# Patient Record
Sex: Female | Born: 1975
Health system: Southern US, Community
[De-identification: ages and names within clinical notes are randomized; demographics above are authoritative.]

## PROBLEM LIST (undated history)

## (undated) DIAGNOSIS — L509 Urticaria, unspecified: Secondary | ICD-10-CM

## (undated) DIAGNOSIS — B373 Candidiasis of vulva and vagina: Secondary | ICD-10-CM

## (undated) DIAGNOSIS — F419 Anxiety disorder, unspecified: Secondary | ICD-10-CM

## (undated) DIAGNOSIS — M1990 Unspecified osteoarthritis, unspecified site: Secondary | ICD-10-CM

## (undated) DIAGNOSIS — B019 Varicella without complication: Secondary | ICD-10-CM

## (undated) DIAGNOSIS — N946 Dysmenorrhea, unspecified: Secondary | ICD-10-CM

## (undated) DIAGNOSIS — N939 Abnormal uterine and vaginal bleeding, unspecified: Secondary | ICD-10-CM

## (undated) DIAGNOSIS — J302 Other seasonal allergic rhinitis: Secondary | ICD-10-CM

## (undated) DIAGNOSIS — B3731 Acute candidiasis of vulva and vagina: Secondary | ICD-10-CM

## (undated) DIAGNOSIS — B009 Herpesviral infection, unspecified: Secondary | ICD-10-CM

## (undated) HISTORY — DX: Dysmenorrhea, unspecified: N94.6

## (undated) HISTORY — DX: Unspecified osteoarthritis, unspecified site: M19.90

## (undated) HISTORY — DX: Urticaria, unspecified: L50.9

## (undated) HISTORY — DX: Candidiasis of vulva and vagina: B37.3

## (undated) HISTORY — DX: Varicella without complication: B01.9

## (undated) HISTORY — DX: Abnormal uterine and vaginal bleeding, unspecified: N93.9

## (undated) HISTORY — DX: Anxiety disorder, unspecified: F41.9

## (undated) HISTORY — DX: Other seasonal allergic rhinitis: J30.2

## (undated) HISTORY — PX: WISDOM TOOTH EXTRACTION: SHX21

## (undated) HISTORY — DX: Herpesviral infection, unspecified: B00.9

## (undated) HISTORY — DX: Acute candidiasis of vulva and vagina: B37.31

---

## 2002-07-24 ENCOUNTER — Other Ambulatory Visit: Admission: RE | Admit: 2002-07-24 | Discharge: 2002-07-24 | Payer: Self-pay | Admitting: Obstetrics & Gynecology

## 2003-10-12 ENCOUNTER — Other Ambulatory Visit: Admission: RE | Admit: 2003-10-12 | Discharge: 2003-10-12 | Payer: Self-pay | Admitting: Obstetrics and Gynecology

## 2004-09-13 ENCOUNTER — Other Ambulatory Visit: Admission: RE | Admit: 2004-09-13 | Discharge: 2004-09-13 | Payer: Self-pay | Admitting: Obstetrics and Gynecology

## 2007-11-21 ENCOUNTER — Inpatient Hospital Stay (HOSPITAL_COMMUNITY): Admission: AD | Admit: 2007-11-21 | Discharge: 2007-11-24 | Payer: Self-pay | Admitting: Obstetrics and Gynecology

## 2010-11-03 ENCOUNTER — Inpatient Hospital Stay (HOSPITAL_COMMUNITY)
Admission: AD | Admit: 2010-11-03 | Discharge: 2010-11-05 | DRG: 774 | Disposition: A | Discharge status: Home / Self Care | Payer: Managed Care, Other (non HMO) | Source: Ambulatory Visit | Attending: Obstetrics and Gynecology | Admitting: Obstetrics and Gynecology

## 2010-11-03 DIAGNOSIS — O4290 Premature rupture of membranes, unspecified as to length of time between rupture and onset of labor, unspecified weeks of gestation: Principal | ICD-10-CM | POA: Diagnosis present

## 2010-11-03 LAB — CBC
Hemoglobin: 12.4 g/dL (ref 12.0–15.0)
MCHC: 34 g/dL (ref 30.0–36.0)
MCV: 93.9 fL (ref 78.0–100.0)
Platelets: 287 10*3/uL (ref 150–400)
RBC: 3.62 MIL/uL — ABNORMAL LOW (ref 3.87–5.11)
WBC: 10.1 10*3/uL (ref 4.0–10.5)
WBC: 8.4 10*3/uL (ref 4.0–10.5)

## 2010-11-03 LAB — RPR: RPR Ser Ql: NONREACTIVE

## 2010-11-04 LAB — CBC
Hemoglobin: 11 g/dL — ABNORMAL LOW (ref 12.0–15.0)
MCH: 32.2 pg (ref 26.0–34.0)
RBC: 3.42 MIL/uL — ABNORMAL LOW (ref 3.87–5.11)

## 2010-11-04 MED ORDER — BISACODYL 10 MG RE SUPP: 10.0000 mg | Freq: Every day | RECTAL | Status: DC | PRN

## 2010-11-04 MED ORDER — WITCH HAZEL-GLYCERIN EX PADS
MEDICATED_PAD | CUTANEOUS | Status: DC | PRN
  Filled 2010-11-04: qty 100

## 2010-11-04 MED ORDER — ZOLPIDEM TARTRATE 5 MG PO TABS: 5.0000 mg | ORAL_TABLET | Freq: Every evening | ORAL | Status: DC | PRN

## 2010-11-04 MED ORDER — IBUPROFEN 600 MG PO TABS
600.0000 mg | ORAL_TABLET | Freq: Four times a day (QID) | ORAL | Status: DC
  Administered 2010-11-05 (×2): 600 mg via ORAL
  Filled 2010-11-04: qty 1

## 2010-11-04 MED ORDER — ONDANSETRON HCL 4 MG PO TABS: 4.0000 mg | ORAL_TABLET | ORAL | Status: DC | PRN

## 2010-11-04 MED ORDER — FLEET ENEMA 7-19 GM/118ML RE ENEM
1.0000 | ENEMA | Freq: Every day | RECTAL | Status: DC | PRN
  Filled 2010-11-04: qty 1

## 2010-11-04 MED ORDER — SENNOSIDES-DOCUSATE SODIUM 8.6-50 MG PO TABS: 1.0000 | ORAL_TABLET | Freq: Every day | ORAL | Status: DC

## 2010-11-04 MED ORDER — MAGNESIUM HYDROXIDE 400 MG/5ML PO SUSP: 30.0000 mL | ORAL | Status: DC | PRN

## 2010-11-04 MED ORDER — HYDROCORTISONE ACE-PRAMOXINE 1-1 % RE FOAM: 1.0000 | RECTAL | Status: DC | PRN

## 2010-11-04 MED ORDER — DIBUCAINE 1 % RE OINT: TOPICAL_OINTMENT | RECTAL | Status: DC | PRN

## 2010-11-04 MED ORDER — CALCIUM CARBONATE ANTACID 500 MG PO CHEW: 2.0000 | CHEWABLE_TABLET | Freq: Every day | ORAL | Status: DC

## 2010-11-04 MED ORDER — METHYLERGONOVINE MALEATE 0.2 MG/ML IJ SOLN: 0.2000 mg | Freq: Four times a day (QID) | INTRAMUSCULAR | Status: DC | PRN

## 2010-11-04 MED ORDER — SIMETHICONE 80 MG PO CHEW
80.0000 mg | CHEWABLE_TABLET | ORAL | Status: DC | PRN
  Filled 2010-11-04: qty 1

## 2010-11-04 MED ORDER — DIPHENHYDRAMINE HCL 25 MG PO CAPS: 25.0000 mg | ORAL_CAPSULE | Freq: Four times a day (QID) | ORAL | Status: DC | PRN

## 2010-11-04 MED ORDER — METHYLERGONOVINE MALEATE 0.2 MG PO TABS: 0.2000 mg | ORAL_TABLET | ORAL | Status: DC | PRN

## 2010-11-04 MED ORDER — OXYCODONE-ACETAMINOPHEN 5-325 MG PO TABS: 1.0000 | ORAL_TABLET | ORAL | Status: DC | PRN

## 2010-11-04 MED ORDER — MEASLES, MUMPS & RUBELLA VAC ~~LOC~~ INJ
0.5000 mL | INJECTION | SUBCUTANEOUS | Status: DC | PRN
  Filled 2010-11-04: qty 0.5

## 2010-11-04 MED ORDER — PSEUDOEPHEDRINE HCL 30 MG PO TABS: 60.0000 mg | ORAL_TABLET | ORAL | Status: DC | PRN

## 2010-11-04 MED ORDER — GUAIFENESIN 100 MG/5ML PO SOLN: 15.0000 mL | ORAL | Status: DC | PRN

## 2010-11-04 MED ORDER — PRENATAL PLUS 27-1 MG PO TABS
1.0000 | ORAL_TABLET | Freq: Every day | ORAL | Status: DC
  Administered 2010-11-05: 1 via ORAL

## 2010-11-04 MED ORDER — FERROUS SULFATE 325 (65 FE) MG PO TABS
325.0000 mg | ORAL_TABLET | Freq: Two times a day (BID) | ORAL | Status: DC
  Administered 2010-11-05: 325 mg via ORAL
  Filled 2010-11-04: qty 1

## 2010-11-04 MED ORDER — SODIUM CHLORIDE 0.9 % IJ SOLN
3.0000 mL | Freq: Two times a day (BID) | INTRAMUSCULAR | Status: DC
  Filled 2010-11-04: qty 3

## 2010-11-04 MED ORDER — MENTHOL 3 MG MT LOZG: 1.0000 | LOZENGE | OROMUCOSAL | Status: DC | PRN

## 2010-11-04 MED ORDER — CALCIUM CARBONATE ANTACID 500 MG PO CHEW: 1.0000 | CHEWABLE_TABLET | ORAL | Status: DC | PRN

## 2010-11-04 MED ORDER — OXYTOCIN 10 UNIT/ML IJ SOLN
20.0000 [IU] | INTRAVENOUS | Status: DC | PRN
  Filled 2010-11-04: qty 2

## 2010-11-04 MED ORDER — BENZOCAINE-MENTHOL 20-0.5 % EX AERO: 1.0000 "application " | INHALATION_SPRAY | Freq: Four times a day (QID) | CUTANEOUS | Status: DC | PRN

## 2010-11-05 MED ORDER — HYDROCODONE-ACETAMINOPHEN 5-500 MG PO TABS: 1.0000 | ORAL_TABLET | ORAL | Status: AC | PRN

## 2010-11-05 NOTE — Progress Notes (Signed)
Post Partum Day 2  Subjective: no complaints, up ad lib, voiding, tolerating PO and + flatus  Objective: Blood pressure 103/62, pulse 67, temperature 98.8 F (37.1 C), temperature source Oral, resp. rate 20.  Physical Exam:  General: alert, cooperative and no distress Lochia: appropriate Uterine Fundus: firm Incision: N/A DVT Evaluation: No evidence of DVT seen on physical exam.   Basename 11/04/10 0545 11/03/10 1335  HGB 11.0* 11.6*  HCT 32.1* 34.0*    Assessment/Plan: Discharge home   LOS: 2 days   Emileo Semel H. 11/05/2010, 1:19 PM

## 2010-11-05 NOTE — Discharge Summary (Signed)
Obstetric Discharge Summary Reason for Admission: onset of labor Prenatal Procedures: ultrasound Intrapartum Procedures: spontaneous vaginal delivery Postpartum Procedures: none Complications-Operative and Postpartum: 2 degree perineal laceration  Hemoglobin  Date Value Range Status  11/04/2010 11.0* 12.0-15.0 (g/dL) Final     HCT  Date Value Range Status  11/04/2010 32.1* 36.0-46.0 (%) Final    Discharge Diagnoses: Term Pregnancy-delivered  Discharge Information: Date: 11/05/2010 Activity: pelvic rest Diet: routine Medications: Viocodin Condition: stable Instructions: refer to practice specific booklet Discharge to: home   Newborn Data: Live born  Information for the patient's newborn:  Shantara, Goosby [[id number]]  female ; APGAR , ; weight ;  Home with mother.  Lydia Torres. 11/05/2010, 11:06 AM

## 2011-01-27 LAB — CBC
HCT: 30.4 — ABNORMAL LOW
HCT: 39.6
Hemoglobin: 10.6 — ABNORMAL LOW
MCV: 96.7
Platelets: 327
RBC: 3.17 — ABNORMAL LOW
RBC: 4.1
RDW: 13.3
WBC: 9.9

## 2011-01-27 LAB — RPR: RPR Ser Ql: NONREACTIVE

## 2011-09-30 HISTORY — PX: INTRAUTERINE DEVICE INSERTION: SHX323

## 2012-06-18 ENCOUNTER — Other Ambulatory Visit: Payer: Self-pay | Admitting: Obstetrics and Gynecology

## 2012-06-18 DIAGNOSIS — N63 Unspecified lump in unspecified breast: Secondary | ICD-10-CM

## 2012-06-26 ENCOUNTER — Ambulatory Visit
Admission: RE | Admit: 2012-06-26 | Discharge: 2012-06-26 | Disposition: A | Discharge status: Home / Self Care | Payer: BC Managed Care – PPO | Source: Ambulatory Visit | Attending: Obstetrics and Gynecology | Admitting: Obstetrics and Gynecology

## 2012-06-26 DIAGNOSIS — N63 Unspecified lump in unspecified breast: Secondary | ICD-10-CM

## 2014-11-12 ENCOUNTER — Ambulatory Visit (INDEPENDENT_AMBULATORY_CARE_PROVIDER_SITE_OTHER): Payer: BLUE CROSS/BLUE SHIELD | Admitting: Obstetrics and Gynecology

## 2014-11-12 ENCOUNTER — Encounter: Payer: Self-pay | Admitting: Obstetrics and Gynecology

## 2014-11-12 VITALS — BP 100/60 | HR 60 | Resp 18 | Ht 64.75 in | Wt 149.0 lb

## 2014-11-12 DIAGNOSIS — Z0000 Encounter for general adult medical examination without abnormal findings: Secondary | ICD-10-CM

## 2014-11-12 DIAGNOSIS — Z01419 Encounter for gynecological examination (general) (routine) without abnormal findings: Secondary | ICD-10-CM

## 2014-11-12 LAB — POCT URINALYSIS DIPSTICK
BILIRUBIN UA: NEGATIVE
Blood, UA: NEGATIVE
Glucose, UA: NEGATIVE
Ketones, UA: NEGATIVE
Leukocytes, UA: NEGATIVE
Nitrite, UA: NEGATIVE
PROTEIN UA: NEGATIVE
Urobilinogen, UA: NEGATIVE
pH, UA: 5

## 2014-11-12 MED ORDER — VALACYCLOVIR HCL 1 G PO TABS: ORAL_TABLET | ORAL | Status: DC

## 2014-11-12 NOTE — Patient Instructions (Signed)

## 2014-11-12 NOTE — Progress Notes (Signed)
39 y.o. G39P2002 Married Caucasian female here for annual exam.    Has Mirena place June 2013.  This treats heavy and painful periods. Occasionally has spotting.  Considering vasectomy.   Prior patient of mine, returning to care.  Son is 7 and daughter is 4. Works for [employer].  Menarche age 55.   Hx recurrent yeast infections.  When she has them, they last for months or years.  Prolonged Diflucan seems to be the best  This was very difficult to diagnose. Ultimately, it showed up on her pap smear.  Has fever blisters and uses Valtrex for this . Needs refills.   PCP:   Marton Redwood - labs with PCP.   No LMP recorded. Patient is not currently having periods (Reason: IUD).          Sexually active: Yes.    The current method of family planning is IUD.    Exercising: Yes.    Running Smoker:  no  Health Maintenance: Pap:  07/2013? - Normal. History of abnormal Pap:  no MMG:  06/26/12 Diagnostic Bilateral BIRADS1:neg, [place] Right BIRADS1:neg.  This was done due to breast pain and possible right breast lump.  Evaluation was normal. TDaP:  2015 Screening Labs:  Hb today: PCP, Urine today: Clear   reports that she has never smoked. She has never used smokeless tobacco. She reports that she drinks about 1.8 - 3.0 oz of alcohol per week. She reports that she does not use illicit drugs.  Past Medical History  Diagnosis Date  . Abnormal uterine bleeding   . Dysmenorrhea   . Vaginal yeast infection     Recurrent   . HSV-1 infection     fever blisters    Past Surgical History  Procedure Laterality Date  . Intrauterine device insertion  09/2011    Mirena  . Wisdom tooth extraction      Current Outpatient Prescriptions  Medication Sig Dispense Refill  . levonorgestrel (MIRENA) 20 MCG/24HR IUD 1 each by Intrauterine route once. 09/2011    . valACYclovir (VALTREX) 1000 MG tablet Take 2 tablets (2000 mg) by mouth every 12 hours for 24 hours prn. 30 tablet 1   No current  facility-administered medications for this visit.    Family History  Problem Relation Age of Onset  . Diabetes Mother   . Depression Mother   . Anxiety disorder Mother   . Liver disease Father   . Hypertension Father   . Schizophrenia Brother   . Anxiety disorder Brother   . Depression Brother   . Breast cancer Paternal Aunt     2 paternal aunts with breast CA - BrCA negative    ROS:  Pertinent items are noted in HPI.  Otherwise, a comprehensive ROS was negative.  Exam:   BP 100/60 mmHg  Pulse 60  Resp 18  Ht 5' 4.75" (1.645 m)  Wt 149 lb (67.586 kg)  BMI 24.98 kg/m2    General appearance: alert, cooperative and appears stated age Head: Normocephalic, without obvious abnormality, atraumatic Neck: no adenopathy, supple, symmetrical, trachea midline and thyroid normal to inspection and palpation Lungs: clear to auscultation bilaterally Breasts: normal appearance, no masses or tenderness, Inspection negative, No nipple retraction or dimpling, No nipple discharge or bleeding, No axillary or supraclavicular adenopathy Heart: regular rate and rhythm Abdomen: soft, non-tender; bowel sounds normal; no masses,  no organomegaly Extremities: extremities normal, atraumatic, no cyanosis or edema Skin: Skin color, texture, turgor normal. No rashes or lesions Lymph nodes: Cervical,  supraclavicular, and axillary nodes normal. No abnormal inguinal nodes palpated Neurologic: Grossly normal  Pelvic: External genitalia:  no lesions              Urethra:  normal appearing urethra with no masses, tenderness or lesions              Bartholins and Skenes: normal                 Vagina: normal appearing vagina with normal color and discharge, no lesions              Cervix: no lesions and IUD strings seen.              Pap taken: No. Bimanual Exam:  Uterus:  normal size, contour, position, consistency, mobility, non-tender              Adnexa: normal adnexa and no mass, fullness, tenderness               Rectovaginal: No..  Confirms.              Anus:  normal sphincter tone, no lesions  Chaperone was present for exam.  Assessment:   Well woman visit with normal exam. Hx recurrent yeast infections.  Mirena IUD patient.  Hx of HSV I.  Plan: Yearly mammogram recommended after age 73.  Recommended self breast exam.  Pap and HR HPV as above. Information to patient in written form - Calcium, Vitamin D, regular exercise program including cardiovascular and weight bearing exercise. Labs performed.  No..   See orders. Refills given on medications.   Yes.  Valtrex prn fever blisters.  See orders. Discussed benefits of Mirena IUD.  Discussed recurrent yeast infections.  Discussed importance of coming in for vaginitis evaluation.  Patient also agrees to this.   Follow up annually and prn.    After visit summary provided.

## 2014-11-15 ENCOUNTER — Encounter: Payer: Self-pay | Admitting: Obstetrics and Gynecology

## 2014-12-21 ENCOUNTER — Encounter: Payer: Self-pay | Admitting: Obstetrics and Gynecology

## 2015-04-30 ENCOUNTER — Ambulatory Visit: Payer: BLUE CROSS/BLUE SHIELD | Admitting: Podiatry

## 2015-05-02 HISTORY — PX: BUNIONECTOMY: SHX129

## 2015-05-12 ENCOUNTER — Encounter: Payer: Self-pay | Admitting: Podiatry

## 2015-05-12 ENCOUNTER — Ambulatory Visit (INDEPENDENT_AMBULATORY_CARE_PROVIDER_SITE_OTHER): Payer: BLUE CROSS/BLUE SHIELD | Admitting: Podiatry

## 2015-05-12 ENCOUNTER — Ambulatory Visit (INDEPENDENT_AMBULATORY_CARE_PROVIDER_SITE_OTHER): Payer: BLUE CROSS/BLUE SHIELD

## 2015-05-12 ENCOUNTER — Ambulatory Visit: Payer: BLUE CROSS/BLUE SHIELD

## 2015-05-12 ENCOUNTER — Telehealth: Payer: Self-pay | Admitting: *Deleted

## 2015-05-12 VITALS — BP 103/67 | HR 55 | Resp 16

## 2015-05-12 DIAGNOSIS — M21619 Bunion of unspecified foot: Secondary | ICD-10-CM

## 2015-05-12 NOTE — Telephone Encounter (Signed)
"  I was there today and scheduled surgery for 01/31.  I was told that 01/24 was available.  Is it still available?  I'd like to switch to that date if possible."  That date is available.  I'll make the switch.  Go ahead and register with the surgical center.  Instructions are in your surgical packet.  "I'm scheduled to come in and see him on 05/24/2015.  Do I need to change that to next week?"  No, that date will be fine.

## 2015-05-12 NOTE — Progress Notes (Signed)
   Subjective:    Patient ID: Lydia Torres, female    DOB: 07/01/1975, 40 y.o.   MRN: [id number]  HPI Pt presents with bilateral bunions right over left with h/o infection and blister for which she was prescribed topical abt cream.   Review of Systems  All other systems reviewed and are negative.      Objective:   Physical Exam        Assessment & Plan:

## 2015-05-12 NOTE — Progress Notes (Signed)
Subjective:     Patient ID: Lydia Torres, female   DOB: 1975-11-15, 40 y.o.   MRN: [id number]  HPI patient presents stating that she has a painful bunion on her right foot that had opened up and developed a blister and became infected and has been treated with topical antibiotics. Patient states it's doing better and she's had a history of structural bunions and needs this right one fixed   Review of Systems  All other systems reviewed and are negative.      Objective:   Physical Exam  Constitutional: She is oriented to person, place, and time.  Cardiovascular: Intact distal pulses.   Musculoskeletal: Normal range of motion.  Neurological: She is oriented to person, place, and time.  Skin: Skin is warm.  Nursing note and vitals reviewed.  neurovascular status intact with muscle strength adequate and range of motion within normal limits. Patient's noted to have a large structural deformity right over left with redness around the first metatarsal head right over left and no opening of skin and no proximal edema erythema or drainage noted. 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 Discussed metatarsus adductus deformity and at this time I have recommended due to long-term nature and pain correction of the right foot with aggressive distal osteotomy explaining we will probably not obtain complete correction. Patient wants surgery and I evaluated and explained this to her and she will reappoint for consult and is tentatively scheduled for surgery in the next month

## 2015-05-24 ENCOUNTER — Encounter: Payer: Self-pay | Admitting: Podiatry

## 2015-05-24 ENCOUNTER — Ambulatory Visit (INDEPENDENT_AMBULATORY_CARE_PROVIDER_SITE_OTHER): Payer: BLUE CROSS/BLUE SHIELD | Admitting: Podiatry

## 2015-05-24 VITALS — BP 103/66 | HR 59 | Resp 16

## 2015-05-24 DIAGNOSIS — M21619 Bunion of unspecified foot: Secondary | ICD-10-CM

## 2015-05-24 NOTE — Patient Instructions (Signed)
Pre-Operative Instructions  Congratulations, you have decided to take an important step to improving your quality of life.  You can be assured that the doctors of [facility] will be with you every step of the way.  1. Plan to be at the surgery center/hospital at least 1 (one) hour prior to your scheduled time unless otherwise directed by the surgical center/hospital staff.  You must have a responsible adult accompany you, remain during the surgery and drive you home.  Make sure you have directions to the surgical center/hospital and know how to get there on time. 2. For hospital based surgery you will need to obtain a history and physical form from your family physician within 1 month prior to the date of surgery- we will give you a form for you primary physician.  3. We make every effort to accommodate the date you request for surgery.  There are however, times where surgery dates or times have to be moved.  We will contact you as soon as possible if a change in schedule is required.   4. No Aspirin/Ibuprofen for one week before surgery.  If you are on aspirin, any non-steroidal anti-inflammatory medications (Mobic, Aleve, Ibuprofen) you should stop taking it 7 days prior to your surgery.  You make take Tylenol  For pain prior to surgery.  5. Medications- If you are taking daily heart and blood pressure medications, seizure, reflux, allergy, asthma, anxiety, pain or diabetes medications, make sure the surgery center/hospital is aware before the day of surgery so they may notify you which medications to take or avoid the day of surgery. 6. No food or drink after midnight the night before surgery unless directed otherwise by surgical center/hospital staff. 7. No alcoholic beverages 24 hours prior to surgery.  No smoking 24 hours prior to or 24 hours after surgery. 8. Wear loose pants or shorts- loose enough to fit over bandages, boots, and casts. 9. No slip on shoes, sneakers are best. 10. Bring  your boot with you to the surgery center/hospital.  Also bring crutches or a walker if your physician has prescribed it for you.  If you do not have this equipment, it will be provided for you after surgery. 11. If you have not been contracted by the surgery center/hospital by the day before your surgery, call to confirm the date and time of your surgery. 12. Leave-time from work may vary depending on the type of surgery you have.  Appropriate arrangements should be made prior to surgery with your employer. 13. Prescriptions will be provided immediately following surgery by your doctor.  Have these filled as soon as possible after surgery and take the medication as directed. 14. Remove nail polish on the operative foot. 15. Wash the night before surgery.  The night before surgery wash the foot and leg well with the antibacterial soap provided and water paying special attention to beneath the toenails and in between the toes.  Rinse thoroughly with water and dry well with a towel.  Perform this wash unless told not to do so by your physician.  Enclosed: 1 Ice pack (please put in freezer the night before surgery)   1 Hibiclens skin cleaner   Pre-op Instructions  If you have any questions regarding the instructions, do not hesitate to call our office.  [place]: [address] [place], [place] [address] [phone]  [place]: [address] [place], [place] [address] [phone]  [place]: [address]  [place],  [address] [phone]  Dr. Richard   Tuchman DPM, Dr. Norman Regal DPM Dr. Richard Sikora DPM, Dr. M. Todd Hyatt DPM, Dr. Kathryn Egerton DPM 

## 2015-05-25 ENCOUNTER — Encounter: Payer: Self-pay | Admitting: Podiatry

## 2015-05-25 DIAGNOSIS — M2011 Hallux valgus (acquired), right foot: Secondary | ICD-10-CM | POA: Diagnosis not present

## 2015-05-25 NOTE — Progress Notes (Signed)
Subjective:     Patient ID: Lydia Torres, female   DOB: 1975-12-14, 40 y.o.   MRN: [id number]  HPI patient presents with husband to discuss structural bunion correction right which is been extremely sore and makes shoe gear difficult   Review of Systems     Objective:   Physical Exam Neurovascular status intact muscle strength adequate with hyperostosis and redness first metatarsal right over left with deviation of the big toe against the second toe and multiple signs of metatarsus adductus deformity    Assessment:     Structural HAV deformity right made worse and magnified by metatarsus adductus deformity    Plan:     Reviewed x-rays at great length with patient and husband and explained distal osteotomy and the fact we should be able to put this in a much better functioning position reduce the discomfort she is in but we will not be correcting the metatarsus adductus deformity. Patient understands this and wants this procedure and I allowed her to read consent form going over alternative treatments complications associated with distal osteotomy and explained to her all complications as listed. She wants surgery signed consent form and scheduled for outpatient distal osteotomy surgery right with fixation. At this time I did go ahead and I dispensed air fracture walker for postoperative usage we also reviewed the recovery can take upwards of 6 months

## 2015-05-26 ENCOUNTER — Telehealth: Payer: Self-pay | Admitting: *Deleted

## 2015-05-26 NOTE — Telephone Encounter (Addendum)
Tresa Endo states she needs information concerning this pt's surgery:  DOS, CPT, ICD10, next appt and estimated date for return to work.  Algis Greenhouse to take care of the disability.

## 2015-05-27 ENCOUNTER — Telehealth: Payer: Self-pay | Admitting: Sports Medicine

## 2015-05-27 ENCOUNTER — Telehealth: Payer: Self-pay | Admitting: *Deleted

## 2015-05-27 NOTE — Telephone Encounter (Signed)
Pt states she had urinary retention stopped Percocet and began alternating ibuprofen and tylenol.  Pt complains of severe constipation and nausea, has used OTC stool softners, suppositories and fiber.  Dr. Charlsie Merles state use Dulcolax.  Informed pt of Dr. Beverlee Nims orders.  Pt states she has done that.  I referred pt to her primary doctor is she was severely constipated and uncomfortable.

## 2015-05-27 NOTE — Telephone Encounter (Signed)
Patient called stated that her bladder feels full has unable to successfully urinate up until last night, which it was difficult, and after much straining effort family produce a small amount of urine. Explained to patient that urinary retention is likely from anesthesia that she received from surgery and is less likely related to pain medications. Advised patient that if urinary retention continues to report to the emergency room for bladder scan and possible catheterization. Patient expressed understanding. Informed patient to call back if she has significant issues or problems. -Dr. Marylene Land

## 2015-06-02 ENCOUNTER — Ambulatory Visit: Payer: BLUE CROSS/BLUE SHIELD

## 2015-06-02 ENCOUNTER — Telehealth: Payer: Self-pay | Admitting: *Deleted

## 2015-06-02 ENCOUNTER — Encounter: Payer: Self-pay | Admitting: Podiatry

## 2015-06-02 ENCOUNTER — Ambulatory Visit (INDEPENDENT_AMBULATORY_CARE_PROVIDER_SITE_OTHER): Payer: BLUE CROSS/BLUE SHIELD | Admitting: Podiatry

## 2015-06-02 VITALS — Temp 97.6°F

## 2015-06-02 DIAGNOSIS — M21619 Bunion of unspecified foot: Secondary | ICD-10-CM | POA: Diagnosis not present

## 2015-06-02 DIAGNOSIS — M2011 Hallux valgus (acquired), right foot: Secondary | ICD-10-CM | POA: Diagnosis not present

## 2015-06-02 DIAGNOSIS — Z9889 Other specified postprocedural states: Secondary | ICD-10-CM

## 2015-06-02 NOTE — Telephone Encounter (Signed)
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  Please fax these restrictions to [phone] or call.

## 2015-06-02 NOTE — Progress Notes (Signed)
Subjective:     Patient ID: Lydia Torres, female   DOB: [birth year], 40 y.o.   MRN: [id number]  HPI patient states I'm doing quite a bit better with my right foot with diminished discomfort and I am able to walk without a lot of swelling or pain   Review of Systems     Objective:   Physical Exam  neurovascular status intact negative Homans sign noted with well coapted incision site right first metatarsal with hallux in rectus position and good range of motion dorsi and plantarflexion with no crepitus    Assessment:      doing well post osteotomy first metatarsal right    Plan:      x-rays reviewed with patient and advised this patient on the continuation of physical therapy  Compression and immobilization. Patient will be seen back in 2-3 weeks or earlier if any issues should occur

## 2015-06-03 ENCOUNTER — Other Ambulatory Visit: Payer: BLUE CROSS/BLUE SHIELD

## 2015-06-24 ENCOUNTER — Encounter: Payer: Self-pay | Admitting: Podiatry

## 2015-06-24 ENCOUNTER — Ambulatory Visit: Payer: Self-pay

## 2015-06-24 ENCOUNTER — Ambulatory Visit (INDEPENDENT_AMBULATORY_CARE_PROVIDER_SITE_OTHER): Payer: BLUE CROSS/BLUE SHIELD | Admitting: Podiatry

## 2015-06-24 DIAGNOSIS — M21619 Bunion of unspecified foot: Secondary | ICD-10-CM

## 2015-06-24 DIAGNOSIS — Z9889 Other specified postprocedural states: Secondary | ICD-10-CM

## 2015-06-24 DIAGNOSIS — M2011 Hallux valgus (acquired), right foot: Secondary | ICD-10-CM | POA: Diagnosis not present

## 2015-06-24 NOTE — Progress Notes (Signed)
Subjective:     Patient ID: Lydia Torres, female   DOB: Apr 26, 1976, 40 y.o.   MRN: [id number]  HPI patient states I'm doing real well with my right foot with mild swelling if him on it all day but overall doing well   Review of Systems     Objective:   Physical Exam Neurovascular status intact muscle strength adequate with patient found to have good range of motion but still somewhat restricted first MPJ right with wound edges well coapted and good alignment    Assessment:     Doing well post osteotomy first metatarsal right with good alignment noted and mild restriction range of motion    Plan:     Reviewed x-rays of the foot and allow patient to return to soft shoes and gradual increase in activity and reappoint in about 8 weeks  X-ray report indicates pin is in place with good alignment noted and joint congruence

## 2015-07-20 NOTE — Progress Notes (Signed)
Patient ID: Lydia Torres, female   DOB: [birth year], 40 y.o.   MRN: [id number] Dr. Charlsie Merlesegal performed an [facility] Bunionectomy (cutting and moving bone) w/ pin fixation right foot or screw [facility]2015 [facility]

## 2015-08-04 DIAGNOSIS — Z111 Encounter for screening for respiratory tuberculosis: Secondary | ICD-10-CM | POA: Diagnosis not present

## 2015-08-26 ENCOUNTER — Encounter: Payer: Self-pay | Admitting: Podiatry

## 2015-08-26 ENCOUNTER — Ambulatory Visit (INDEPENDENT_AMBULATORY_CARE_PROVIDER_SITE_OTHER): Payer: BLUE CROSS/BLUE SHIELD | Admitting: Podiatry

## 2015-08-26 ENCOUNTER — Ambulatory Visit (INDEPENDENT_AMBULATORY_CARE_PROVIDER_SITE_OTHER): Payer: BLUE CROSS/BLUE SHIELD

## 2015-08-26 VITALS — BP 99/60 | HR 86 | Resp 12

## 2015-08-26 DIAGNOSIS — M21619 Bunion of unspecified foot: Secondary | ICD-10-CM

## 2015-08-26 DIAGNOSIS — Z9889 Other specified postprocedural states: Secondary | ICD-10-CM

## 2015-08-26 NOTE — Progress Notes (Signed)
Subjective:     Patient ID: Lydia Torres, female   DOB: Jun 19, 1975, 40 y.o.   MRN: [id number]  HPI patient presents stating my right foot feeling good and [facility] [employer] [facility] problem   Review of Systems     Objective:   Physical Exam Neurovascular status intact muscle strength adequate with discomfort in the plantar right first MPJ which is minimal with range of motion which is good with no crepitus of the joint noted    Assessment:     Doing well post osteotomy first metatarsal right with wound edges well coapted    Plan:     Reviewed condition and recommended long-term continued range of motion exercises. Reviewed x-rays with patient discharge at this time and reappoint as needed  X-ray report indicates that the osteotomy is healing well with good alignment noted and good positional component with pins in place

## 2015-11-18 ENCOUNTER — Ambulatory Visit (INDEPENDENT_AMBULATORY_CARE_PROVIDER_SITE_OTHER): Payer: BLUE CROSS/BLUE SHIELD | Admitting: Obstetrics and Gynecology

## 2015-11-18 ENCOUNTER — Encounter: Payer: Self-pay | Admitting: Obstetrics and Gynecology

## 2015-11-18 VITALS — BP 102/64 | HR 70 | Resp 14 | Ht 64.5 in | Wt 153.8 lb

## 2015-11-18 DIAGNOSIS — Z0000 Encounter for general adult medical examination without abnormal findings: Secondary | ICD-10-CM

## 2015-11-18 DIAGNOSIS — Z833 Family history of diabetes mellitus: Secondary | ICD-10-CM

## 2015-11-18 DIAGNOSIS — Z01419 Encounter for gynecological examination (general) (routine) without abnormal findings: Secondary | ICD-10-CM | POA: Diagnosis not present

## 2015-11-18 LAB — CBC
HCT: 39.6 % (ref 35.0–45.0)
HEMOGLOBIN: 13.8 g/dL (ref 11.7–15.5)
MCH: 30.9 pg (ref 27.0–33.0)
MCHC: 34.8 g/dL (ref 32.0–36.0)
MCV: 88.8 fL (ref 80.0–100.0)
MPV: 9.9 fL (ref 7.5–12.5)
Platelets: 345 10*3/uL (ref 140–400)
RBC: 4.46 MIL/uL (ref 3.80–5.10)
RDW: 13.3 % (ref 11.0–15.0)
WBC: 9.1 10*3/uL (ref 3.8–10.8)

## 2015-11-18 LAB — COMPREHENSIVE METABOLIC PANEL
ALBUMIN: 4.1 g/dL (ref 3.6–5.1)
ALT: 17 U/L (ref 6–29)
AST: 21 U/L (ref 10–30)
Alkaline Phosphatase: 39 U/L (ref 33–115)
BILIRUBIN TOTAL: 0.6 mg/dL (ref 0.2–1.2)
BUN: 15 mg/dL (ref 7–25)
CO2: 28 mmol/L (ref 20–31)
CREATININE: 0.75 mg/dL (ref 0.50–1.10)
Calcium: 9.2 mg/dL (ref 8.6–10.2)
Chloride: 101 mmol/L (ref 98–110)
Glucose, Bld: 78 mg/dL (ref 65–99)
Potassium: 3.9 mmol/L (ref 3.5–5.3)
SODIUM: 140 mmol/L (ref 135–146)
TOTAL PROTEIN: 6.4 g/dL (ref 6.1–8.1)

## 2015-11-18 LAB — POCT URINALYSIS DIPSTICK
BILIRUBIN UA: NEGATIVE
Glucose, UA: NEGATIVE
KETONES UA: NEGATIVE
Leukocytes, UA: NEGATIVE
Nitrite, UA: NEGATIVE
PH UA: 5
Protein, UA: NEGATIVE
RBC UA: NEGATIVE
Urobilinogen, UA: NEGATIVE

## 2015-11-18 LAB — LIPID PANEL
Cholesterol: 172 mg/dL (ref 125–200)
HDL: 107 mg/dL (ref >=46)
LDL CALC: 55 mg/dL (ref <130)
Total CHOL/HDL Ratio: 1.6 Ratio (ref <=5.0)
Triglycerides: 51 mg/dL (ref <150)
VLDL: 10 mg/dL (ref <30)

## 2015-11-18 NOTE — Progress Notes (Signed)
Patient ID: Lydia Torres, female   DOB: 1976/02/14, 40 y.o.   MRN: [id number] 40 y.o. G49P2002 Married Caucasian female here for annual exam.    Happy with Mirena IUD.  Occasional spotting.  More cramping.   Feels lumpiness in right breast under the right nipple.  This is consistent for her.  It does come and go.   Wants hemoglobin A1C checked.  Used expired chem strips and had fasting blood glucose about 120.  FH of type II DM.  No hx of gestational DM.  Declines refill of Valtrex.  PCP:  Janalyn Rouse, MD   No LMP recorded. Patient is not currently having periods (Reason: IUD).     Period Cycle (Days):  (no cycles due to Mirena IUD)     Sexually active: Yes.    The current method of family planning is IUD--Mirena inserted 09/2011.    Exercising: Yes.    running and walking. Smoker:  no  Health Maintenance: Pap:  07/2013 normal History of abnormal Pap:  no MMG:  06-26-12 Diag.Bil & Rt.br.US/density cat.3/dermal calcification present/Neg/BiRads1:screening age 89:[facility] Colonoscopy:  n/a BMD:   n/a  Result  n/a TDaP:  2015 Gardasil:   no   Screening Labs:    Urine today: Neg   reports that she has never smoked. She has never used smokeless tobacco. She reports that she drinks about 1.8 - 2.4 oz of alcohol per week. She reports that she does not use illicit drugs.  Past Medical History  Diagnosis Date  . Abnormal uterine bleeding   . Dysmenorrhea   . Vaginal yeast infection     Recurrent   . HSV-1 infection     fever blisters    Past Surgical History  Procedure Laterality Date  . Intrauterine device insertion  09/2011    Mirena  . Wisdom tooth extraction    . Bunionectomy Right 05/2015    Current Outpatient Prescriptions  Medication Sig Dispense Refill  . levonorgestrel (MIRENA) 20 MCG/24HR IUD 1 each by Intrauterine route once. Reported on 05/12/2015    . valACYclovir (VALTREX) 1000 MG tablet Take 2 tablets (2000 mg) by mouth every 12 hours for 24 hours  prn. 30 tablet 1   No current facility-administered medications for this visit.    Family History  Problem Relation Age of Onset  . Diabetes Mother   . Depression Mother   . Anxiety disorder Mother   . Liver disease Father   . Hypertension Father   . Schizophrenia Brother   . Anxiety disorder Brother   . Depression Brother   . Breast cancer Paternal Aunt     2 paternal aunts with breast CA - BrCA negative    ROS:  Pertinent items are noted in HPI.  Otherwise, a comprehensive ROS was negative.  Exam:   BP 102/64 mmHg  Pulse 70  Resp 14  Ht 5' 4.5" (1.638 m)  Wt 153 lb 12.8 oz (69.763 kg)  BMI 26.00 kg/m2    General appearance: alert, cooperative and appears stated age Head: Normocephalic, without obvious abnormality, atraumatic Neck: no adenopathy, supple, symmetrical, trachea midline and thyroid normal to inspection and palpation Lungs: clear to auscultation bilaterally Breasts: normal appearance, no masses or tenderness, Inspection negative, No nipple retraction or dimpling, No nipple discharge or bleeding, No axillary or supraclavicular adenopathy Heart: regular rate and rhythm Abdomen:  soft, non-tender; no masses, no organomegaly Extremities: extremities normal, atraumatic, no cyanosis or edema Skin: Skin color, texture, turgor normal. No rashes or  lesions Lymph nodes: Cervical, supraclavicular, and axillary nodes normal. No abnormal inguinal nodes palpated Neurologic: Grossly normal  Pelvic: External genitalia:  no lesions              Urethra:  normal appearing urethra with no masses, tenderness or lesions              Bartholins and Skenes: normal                 Vagina: normal appearing vagina with normal color and discharge, no lesions              Cervix: no lesions and IUD strings seen.              Pap taken: No. Bimanual Exam:  Uterus:  normal size, contour, position, consistency, mobility, non-tender              Adnexa: normal adnexa and no mass,  fullness, tenderness              Rectal exam: Yes.  .  Confirms.              Anus:  normal sphincter tone, no lesions  Chaperone was present for exam.  Assessment:   Well woman visit with normal exam. Mirena IUD patient.  Hx of HSV I. FH of DM.   Plan: Yearly mammogram recommended after age 58.  Recommended self breast exam.  Pap and HR HPV as above. Discussed Calcium, Vitamin D, regular exercise program including cardiovascular and weight bearing exercise. Labs performed.  Yes.  .   See orders.  Routine labs and HgbA1C. Prescription medication(s) given.  No..   Will contact office if needs more Valtrex. Discussed Mirena IUD exchange next year if desires.  She knows she will need to call the office to proceed with this prior to her annual exam next year.  Follow up annually and prn.       After visit summary provided.

## 2015-11-18 NOTE — Patient Instructions (Signed)
Health Maintenance, Female Adopting a healthy lifestyle and getting preventive care can go a long way to promote health and wellness. Talk with your health care provider about what schedule of regular examinations is right for you. This is a good chance for you to check in with your provider about disease prevention and staying healthy. In between checkups, there are plenty of things you can do on your own. Experts have done a lot of research about which lifestyle changes and preventive measures are most likely to keep you healthy. Ask your health care provider for more information. WEIGHT AND DIET  Eat a healthy diet  Be sure to include plenty of vegetables, fruits, low-fat dairy products, and lean protein.  Do not eat a lot of foods high in solid fats, added sugars, or salt.  Get regular exercise. This is one of the most important things you can do for your health.  Most adults should exercise for at least 150 minutes each week. The exercise should increase your heart rate and make you sweat (moderate-intensity exercise).  Most adults should also do strengthening exercises at least twice a week. This is in addition to the moderate-intensity exercise.  Maintain a healthy weight  Body mass index (BMI) is a measurement that can be used to identify possible weight problems. It estimates body fat based on height and weight. Your health care provider can help determine your BMI and help you achieve or maintain a healthy weight.  For females 20 years of age and older:   A BMI below 18.5 is considered underweight.  A BMI of 18.5 to 24.9 is normal.  A BMI of 25 to 29.9 is considered overweight.  A BMI of 30 and above is considered obese.  Watch levels of cholesterol and blood lipids  You should start having your blood tested for lipids and cholesterol at 39 years of age, then have this test every 5 years.  You may need to have your cholesterol levels checked more often if:  Your lipid  or cholesterol levels are high.  You are older than 39 years of age.  You are at high risk for heart disease.  CANCER SCREENING   Lung Cancer  Lung cancer screening is recommended for adults 55-80 years old who are at high risk for lung cancer because of a history of smoking.  A yearly low-dose CT scan of the lungs is recommended for people who:  Currently smoke.  Have quit within the past 15 years.  Have at least a 30-pack-year history of smoking. A pack year is smoking an average of one pack of cigarettes a day for 1 year.  Yearly screening should continue until it has been 15 years since you quit.  Yearly screening should stop if you develop a health problem that would prevent you from having lung cancer treatment.  Breast Cancer  Practice breast self-awareness. This means understanding how your breasts normally appear and feel.  It also means doing regular breast self-exams. Let your health care provider know about any changes, no matter how small.  If you are in your 20s or 30s, you should have a clinical breast exam (CBE) by a health care provider every 1-3 years as part of a regular health exam.  If you are 40 or older, have a CBE every year. Also consider having a breast X-ray (mammogram) every year.  If you have a family history of breast cancer, talk to your health care provider about genetic screening.  If you   are at high risk for breast cancer, talk to your health care provider about having an MRI and a mammogram every year.  Breast cancer gene (BRCA) assessment is recommended for women who have family members with BRCA-related cancers. BRCA-related cancers include:  Breast.  Ovarian.  Tubal.  Peritoneal cancers.  Results of the assessment will determine the need for genetic counseling and BRCA1 and BRCA2 testing. Cervical Cancer Your health care provider may recommend that you be screened regularly for cancer of the pelvic organs (ovaries, uterus, and  vagina). This screening involves a pelvic examination, including checking for microscopic changes to the surface of your cervix (Pap test). You may be encouraged to have this screening done every 3 years, beginning at age 21.  For women ages 30-65, health care providers may recommend pelvic exams and Pap testing every 3 years, or they may recommend the Pap and pelvic exam, combined with testing for human papilloma virus (HPV), every 5 years. Some types of HPV increase your risk of cervical cancer. Testing for HPV may also be done on women of any age with unclear Pap test results.  Other health care providers may not recommend any screening for nonpregnant women who are considered low risk for pelvic cancer and who do not have symptoms. Ask your health care provider if a screening pelvic exam is right for you.  If you have had past treatment for cervical cancer or a condition that could lead to cancer, you need Pap tests and screening for cancer for at least 20 years after your treatment. If Pap tests have been discontinued, your risk factors (such as having a new sexual partner) need to be reassessed to determine if screening should resume. Some women have medical problems that increase the chance of getting cervical cancer. In these cases, your health care provider may recommend more frequent screening and Pap tests. Colorectal Cancer  This type of cancer can be detected and often prevented.  Routine colorectal cancer screening usually begins at 39 years of age and continues through 39 years of age.  Your health care provider may recommend screening at an earlier age if you have risk factors for colon cancer.  Your health care provider may also recommend using home test kits to check for hidden blood in the stool.  A small camera at the end of a tube can be used to examine your colon directly (sigmoidoscopy or colonoscopy). This is done to check for the earliest forms of colorectal  cancer.  Routine screening usually begins at age 50.  Direct examination of the colon should be repeated every 5-10 years through 39 years of age. However, you may need to be screened more often if early forms of precancerous polyps or small growths are found. Skin Cancer  Check your skin from head to toe regularly.  Tell your health care provider about any new moles or changes in moles, especially if there is a change in a mole's shape or color.  Also tell your health care provider if you have a mole that is larger than the size of a pencil eraser.  Always use sunscreen. Apply sunscreen liberally and repeatedly throughout the day.  Protect yourself by wearing long sleeves, pants, a wide-brimmed hat, and sunglasses whenever you are outside. HEART DISEASE, DIABETES, AND HIGH BLOOD PRESSURE   High blood pressure causes heart disease and increases the risk of stroke. High blood pressure is more likely to develop in:  People who have blood pressure in the high end   of the normal range (130-139/85-89 mm Hg).  People who are overweight or obese.  People who are African American.  If you are 38-23 years of age, have your blood pressure checked every 3-5 years. If you are 61 years of age or older, have your blood pressure checked every year. You should have your blood pressure measured twice--once when you are at a hospital or clinic, and once when you are not at a hospital or clinic. Record the average of the two measurements. To check your blood pressure when you are not at a hospital or clinic, you can use:  An automated blood pressure machine at a pharmacy.  A home blood pressure monitor.  If you are between 45 years and 39 years old, ask your health care provider if you should take aspirin to prevent strokes.  Have regular diabetes screenings. This involves taking a blood sample to check your fasting blood sugar level.  If you are at a normal weight and have a low risk for diabetes,  have this test once every three years after 39 years of age.  If you are overweight and have a high risk for diabetes, consider being tested at a younger age or more often. PREVENTING INFECTION  Hepatitis B  If you have a higher risk for hepatitis B, you should be screened for this virus. You are considered at high risk for hepatitis B if:  You were born in a country where hepatitis B is common. Ask your health care provider which countries are considered high risk.  Your parents were born in a high-risk country, and you have not been immunized against hepatitis B (hepatitis B vaccine).  You have HIV or AIDS.  You use needles to inject street drugs.  You live with someone who has hepatitis B.  You have had sex with someone who has hepatitis B.  You get hemodialysis treatment.  You take certain medicines for conditions, including cancer, organ transplantation, and autoimmune conditions. Hepatitis C  Blood testing is recommended for:  Everyone born from [birth year] through 1964.  Anyone with known risk factors for hepatitis C. Sexually transmitted infections (STIs)  You should be screened for sexually transmitted infections (STIs) including gonorrhea and chlamydia if:  You are sexually active and are younger than 39 years of age.  You are older than 39 years of age and your health care provider tells you that you are at risk for this type of infection.  Your sexual activity has changed since you were last screened and you are at an increased risk for chlamydia or gonorrhea. Ask your health care provider if you are at risk.  If you do not have HIV, but are at risk, it may be recommended that you take a prescription medicine daily to prevent HIV infection. This is called pre-exposure prophylaxis (PrEP). You are considered at risk if:  You are sexually active and do not regularly use condoms or know the HIV status of your partner(s).  You take drugs by injection.  You are sexually  active with a partner who has HIV. Talk with your health care provider about whether you are at high risk of being infected with HIV. If you choose to begin PrEP, you should first be tested for HIV. You should then be tested every 3 months for as long as you are taking PrEP.  PREGNANCY   If you are premenopausal and you may become pregnant, ask your health care provider about preconception counseling.  If you may  become pregnant, take 400 to 800 micrograms (mcg) of folic acid every day.  If you want to prevent pregnancy, talk to your health care provider about birth control (contraception). OSTEOPOROSIS AND MENOPAUSE   Osteoporosis is a disease in which the bones lose minerals and strength with aging. This can result in serious bone fractures. Your risk for osteoporosis can be identified using a bone density scan.  If you are 61 years of age or older, or if you are at risk for osteoporosis and fractures, ask your health care provider if you should be screened.  Ask your health care provider whether you should take a calcium or vitamin D supplement to lower your risk for osteoporosis.  Menopause may have certain physical symptoms and risks.  Hormone replacement therapy may reduce some of these symptoms and risks. Talk to your health care provider about whether hormone replacement therapy is right for you.  HOME CARE INSTRUCTIONS   Schedule regular health, dental, and eye exams.  Stay current with your immunizations.   Do not use any tobacco products including cigarettes, chewing tobacco, or electronic cigarettes.  If you are pregnant, do not drink alcohol.  If you are breastfeeding, limit how much and how often you drink alcohol.  Limit alcohol intake to no more than 1 drink per day for nonpregnant women. One drink equals 12 ounces of beer, 5 ounces of wine, or 1 ounces of hard liquor.  Do not use street drugs.  Do not share needles.  Ask your health care provider for help if  you need support or information about quitting drugs.  Tell your health care provider if you often feel depressed.  Tell your health care provider if you have ever been abused or do not feel safe at home.   This information is not intended to replace advice given to you by your health care provider. Make sure you discuss any questions you have with your health care provider.   Document Released: 10/31/2010 Document Revised: 05/08/2014 Document Reviewed: 03/19/2013 Elsevier Interactive Patient Education [employer].

## 2015-11-19 DIAGNOSIS — J029 Acute pharyngitis, unspecified: Secondary | ICD-10-CM | POA: Diagnosis not present

## 2015-11-19 LAB — HEMOGLOBIN A1C
HEMOGLOBIN A1C: 5 % (ref <5.7)
MEAN PLASMA GLUCOSE: 97 mg/dL

## 2016-02-15 DIAGNOSIS — Z23 Encounter for immunization: Secondary | ICD-10-CM | POA: Diagnosis not present

## 2016-06-05 ENCOUNTER — Telehealth: Payer: Self-pay | Admitting: Obstetrics and Gynecology

## 2016-06-05 DIAGNOSIS — Z30433 Encounter for removal and reinsertion of intrauterine contraceptive device: Secondary | ICD-10-CM

## 2016-06-05 NOTE — Telephone Encounter (Signed)
Spoke with patient. Patient would like to schedule Mirena removal and replaced. Due for replacement by 09/2016. Patient is requesting an early morning appointment due to work schedule. Appointment scheduled for 06/12/2016 at 9 am with Dr.Silva. Patient is agreeable to date and time. Pre procedure instructions given.  Motrin instructions given. Motrin=Advil=Ibuprofen, 800 mg one hour before appointment. Eat a meal and hydrate well before appointment. Orders placed for precert.  Routing to provider for final review. Patient agreeable to disposition. Will close encounter.

## 2016-06-05 NOTE — Telephone Encounter (Signed)
Patient called to speak with the nurse about scheduling an appointment to have her Mirena replaced.

## 2016-06-09 ENCOUNTER — Telehealth: Payer: Self-pay | Admitting: Obstetrics and Gynecology

## 2016-06-09 NOTE — Telephone Encounter (Signed)
Spoke with patient. Patient has a Mirena IUD in place. Is getting this IUD removed and replaced with another IUD on 06/12/2016. States when she had her first Mirena placed she had irregular bleeding for 6 months. This was a new start with Mirena for her. Asking if this will occur again. Advised patient irregular bleeding following insertion of IUD is typically during the first 3-6 months of initial insertion while the body adjusts to the hormones. As she has already had her IUD in placed and her body has regulated should not have long irregular bleeding. Advised may have a couple of days of spotting following removal and reinsertion, but this should subside. Asking when she can have intercourse following insertion. Advised this will be based on her comfort level. Will be covered for contraception as soon as IUD is replaced. Patient verbalizes understanding. Pre procedure instructions given.  Motrin instructions given. Motrin=Advil=Ibuprofen, 800 mg one hour before appointment. Eat a meal and hydrate well before appointment.    Routing to Dr.Silva for review and advise.

## 2016-06-09 NOTE — Telephone Encounter (Signed)
Patient has an appointment on Monday, 06/12/16 for a Mirena.  She is requesting to speak with the nurse to ask questions about what she can expect as far as bleeding or having a cycle when she gets it inserted.

## 2016-06-11 NOTE — Telephone Encounter (Signed)
I agree with your recommendations and advice.  She will be protected against pregnancy immediately with the switch in the IUD as her current IUD is still hormonally active.  You may close the encounter.

## 2016-06-12 ENCOUNTER — Encounter: Payer: Self-pay | Admitting: Obstetrics and Gynecology

## 2016-06-12 ENCOUNTER — Ambulatory Visit (INDEPENDENT_AMBULATORY_CARE_PROVIDER_SITE_OTHER): Payer: BLUE CROSS/BLUE SHIELD | Admitting: Obstetrics and Gynecology

## 2016-06-12 DIAGNOSIS — Z30433 Encounter for removal and reinsertion of intrauterine contraceptive device: Secondary | ICD-10-CM

## 2016-06-12 NOTE — Progress Notes (Signed)
GYNECOLOGY  VISIT   HPI: 41 y.o.   Married  Caucasian  female   G2P2002 with No LMP recorded. Patient is not currently having periods (Reason: IUD).   here for IUD removal and reinsertion . Has Mirena IUD placed 09/2011.  No menses.  This will be her third IUD.  GYNECOLOGIC HISTORY: No LMP recorded. Patient is not currently having periods (Reason: IUD). Contraception:  Mirena  Menopausal hormone therapy:  None Last mammogram:  06/26/12 BIRADS1:neg  Last pap smear:   07/25/13 neg        OB History    Gravida Para Term Preterm AB Living   2 2 2     2    SAB TAB Ectopic Multiple Live Births           2         There are no active problems to display for this patient.   Past Medical History:  Diagnosis Date  . Abnormal uterine bleeding   . Dysmenorrhea   . HSV-1 infection    fever blisters  . Vaginal yeast infection    Recurrent     Past Surgical History:  Procedure Laterality Date  . BUNIONECTOMY Right 05/2015  . INTRAUTERINE DEVICE INSERTION  09/2011   Mirena  . WISDOM TOOTH EXTRACTION      Current Outpatient Prescriptions  Medication Sig Dispense Refill  . levonorgestrel (MIRENA) 20 MCG/24HR IUD 1 each by Intrauterine route once. Reported on 05/12/2015    . valACYclovir (VALTREX) 1000 MG tablet Take 2 tablets (2000 mg) by mouth every 12 hours for 24 hours prn. 30 tablet 1   No current facility-administered medications for this visit.      ALLERGIES: Codeine; Phenergan [promethazine hcl]; and Sulfa antibiotics  Family History  Problem Relation Age of Onset  . Diabetes Mother   . Depression Mother   . Anxiety disorder Mother   . Liver disease Father   . Hypertension Father   . Schizophrenia Brother   . Anxiety disorder Brother   . Depression Brother   . Breast cancer Paternal Aunt     2 paternal aunts with breast CA - BrCA negative    Social History   Social History  . Marital status: Married    Spouse name: N/A  . Number of children: N/A  . Years  of education: N/A   Occupational History  . Not on file.   Social History Main Topics  . Smoking status: Never Smoker  . Smokeless tobacco: Never Used  . Alcohol use 1.8 - 2.4 oz/week    3 - 4 Glasses of wine per week  . Drug use: No  . Sexual activity: Yes    Partners: Male    Birth control/ protection: IUD     Comment: Mirena inserted 09/2011   Other Topics Concern  . Not on file   Social History Narrative  . No narrative on file    ROS:  Pertinent items are noted in HPI.  PHYSICAL EXAMINATION:    BP 104/70 (BP Location: Right Arm, Patient Position: Sitting, Cuff Size: Normal)   Pulse 64   Resp 16   Ht 5' 4.5" (1.638 m)   Wt 158 lb (71.7 kg)   BMI 26.70 kg/m     General appearance: alert, cooperative and appears stated age  Pelvic: External genitalia:  no lesions              Urethra:  normal appearing urethra with no masses, tenderness or lesions  Bartholins and Skenes: normal                 Vagina: normal appearing vagina with normal color and discharge, no lesions              Cervix: no lesions.  IUD strings seen.                Bimanual Exam:  Uterus:  normal size, contour, position, consistency, mobility, non-tender              Adnexa: no mass, fullness, tenderness   Mirena IUD removal and reinsertion of new Mirena IUD.  Lot number [id number], exp 06/20.  Consent for procedure.  Speculum placed.  Hibiclens sterilization.  Paracervical block of 10 cc 1% lidocaine, lot [id number], exp 02/21. Tennaculum to anterior cervical lip. Mirena IUD removed with ring forceps, intact, shown to patient, and discarded. Uterus sounded to 8 cm.  Mirena IUD placed without difficulty.  Strings trimmed.  Bimanual exam repeated and no change noted.  Minimal EBL.  No complications.  Chaperone was present for exam.  ASSESSMENT  Mirena IUD removal and reinsertion of new Mirena IUD.   PLAN  Instructions/precautions given.  Motrin 800 mg po q 8 hrs prn.   Follow up in 4 weeks, sooner as needed.  An After Visit Summary was printed and given to the patient.

## 2016-06-20 DIAGNOSIS — Z111 Encounter for screening for respiratory tuberculosis: Secondary | ICD-10-CM | POA: Diagnosis not present

## 2016-07-17 ENCOUNTER — Ambulatory Visit (INDEPENDENT_AMBULATORY_CARE_PROVIDER_SITE_OTHER): Payer: BLUE CROSS/BLUE SHIELD | Admitting: Obstetrics and Gynecology

## 2016-07-17 ENCOUNTER — Encounter: Payer: Self-pay | Admitting: Obstetrics and Gynecology

## 2016-07-17 VITALS — BP 110/60 | HR 80 | Resp 16 | Wt 156.0 lb

## 2016-07-17 DIAGNOSIS — Z30431 Encounter for routine checking of intrauterine contraceptive device: Secondary | ICD-10-CM

## 2016-07-17 NOTE — Progress Notes (Signed)
GYNECOLOGY  VISIT   HPI: 41 y.o.   Married  Caucasian  female   G2P2002 with No LMP recorded. Patient is not currently having periods (Reason: IUD).   here for   IUD check.  Mirena placed on 06/12/16.  Some cramping for one week post placement.  Spotting for a few days. No bleeding.  Skin feels clearer.  May want strings shortened.  GYNECOLOGIC HISTORY: No LMP recorded. Patient is not currently having periods (Reason: IUD). Contraception:  MIRENA IUD  Menopausal hormone therapy:  n/a Last mammogram:  06/26/12 BIRADS1:neg  Last pap smear:   07/25/13 negative        OB History    Gravida Para Term Preterm AB Living   _0 SAB TAB Ectopic Multiple Live Births           2         There are no active problems to display for this patient.   Past Medical History:  Diagnosis Date  . Abnormal uterine bleeding   . Dysmenorrhea   . HSV-1 infection    fever blisters  . Vaginal yeast infection    Recurrent     Past Surgical History:  Procedure Laterality Date  . BUNIONECTOMY Right 05/2015  . INTRAUTERINE DEVICE INSERTION  09/2011   Mirena  . WISDOM TOOTH EXTRACTION      Current Outpatient Prescriptions  Medication Sig Dispense Refill  . levonorgestrel (MIRENA) 20 MCG/24HR IUD 1 each by Intrauterine route once. Reported on 05/12/2015    . valACYclovir (VALTREX) 1000 MG tablet Take 2 tablets (2000 mg) by mouth every 12 hours for 24 hours prn. 30 tablet 1   No current facility-administered medications for this visit.      ALLERGIES: Codeine; Phenergan [promethazine hcl]; and Sulfa antibiotics  Family History  Problem Relation Age of Onset  . Diabetes Mother   . Depression Mother   . Anxiety disorder Mother   . Liver disease Father   . Hypertension Father   . Schizophrenia Brother   . Anxiety disorder Brother   . Depression Brother   . Breast cancer Paternal Aunt     2 paternal aunts with breast CA - BrCA negative    Social History   Social History  .  Marital status: Married    Spouse name: N/A  . Number of children: N/A  . Years of education: N/A   Occupational History  . Not on file.   Social History Main Topics  . Smoking status: Never Smoker  . Smokeless tobacco: Never Used  . Alcohol use 1.8 - 2.4 oz/week    3 - 4 Glasses of wine per week  . Drug use: No  . Sexual activity: Yes    Partners: Male    Birth control/ protection: IUD     Comment: Mirena inserted 09/2011   Other Topics Concern  . Not on file   Social History Narrative  . No narrative on file    ROS:  Pertinent items are noted in HPI.  PHYSICAL EXAMINATION:    BP 110/60 (BP Location: Right Arm, Patient Position: Sitting, Cuff Size: Normal)   Pulse 80   Resp 16   Wt 156 lb (70.8 kg)   BMI 26.36 kg/m     General appearance: alert, cooperative and appears stated age    Pelvic: External genitalia:  no lesions              Urethra:  normal appearing urethra with no masses, tenderness or lesions              Bartholins and Skenes: normal                 Vagina: normal appearing vagina with normal color and discharge, no lesions              Cervix: no lesions.  IUD strings trimmed to 1.5 cm.                 Bimanual Exam:  Uterus:  normal size, contour, position, consistency, mobility, non-tender              Adnexa: no mass, fullness, tenderness           Chaperone was present for exam.  ASSESSMENT  Mirena IUD patient.  Doing well.   PLAN  Return prn and for annual exam in July 2018.  Strings can be shortened further if desired.   An After Visit Summary was printed and given to the patient.  _15_____ minutes face to face time of which over 50% was spent in counseling.

## 2016-11-23 ENCOUNTER — Ambulatory Visit: Payer: BLUE CROSS/BLUE SHIELD | Admitting: Obstetrics and Gynecology

## 2016-11-27 ENCOUNTER — Ambulatory Visit (INDEPENDENT_AMBULATORY_CARE_PROVIDER_SITE_OTHER): Payer: BLUE CROSS/BLUE SHIELD | Admitting: Obstetrics and Gynecology

## 2016-11-27 ENCOUNTER — Other Ambulatory Visit (HOSPITAL_COMMUNITY)
Admission: RE | Admit: 2016-11-27 | Discharge: 2016-11-27 | Disposition: A | Discharge status: Home / Self Care | Payer: BLUE CROSS/BLUE SHIELD | Source: Ambulatory Visit | Attending: Obstetrics and Gynecology | Admitting: Obstetrics and Gynecology

## 2016-11-27 ENCOUNTER — Encounter: Payer: Self-pay | Admitting: Obstetrics and Gynecology

## 2016-11-27 VITALS — BP 110/70 | HR 72 | Resp 16 | Ht 64.75 in | Wt 157.0 lb

## 2016-11-27 DIAGNOSIS — Z01419 Encounter for gynecological examination (general) (routine) without abnormal findings: Secondary | ICD-10-CM | POA: Diagnosis not present

## 2016-11-27 MED ORDER — VALACYCLOVIR HCL 1 G PO TABS: ORAL_TABLET | ORAL | 1 refills | Status: DC

## 2016-11-27 NOTE — Patient Instructions (Signed)

## 2016-11-27 NOTE — Progress Notes (Signed)
41 y.o. G41P2002 Married Caucasian female here for annual exam.    No menses with Mirena IUD.  No problems with yeast infections.   Occasional night sweats.  Sells diabetes medications.  PCP:   Dr. Brigitte Pulse  No LMP recorded. Patient is not currently having periods (Reason: IUD).           Sexually active: Yes.    The current method of family planning is Mirena IUD inserted 06/12/16.    Exercising: Yes.    high intensity cross training Smoker:  no  Health Maintenance: Pap:  07/2013 normal History of abnormal Pap:  no MMG:  06-26-12 Diag.Bil & Rt.br.US/density cat.3/dermal calcification present/Neg/BiRads1:screening age 27:[facility] Colonoscopy:  n/a BMD:   n/a  Result  n/a TDaP:  2015 HIV: done with pregnancy Hep C: done with pregnancy Screening Labs:  Discuss today, blood drawn and held at lab   reports that she has never smoked. She has never used smokeless tobacco. She reports that she drinks about 1.8 - 2.4 oz of alcohol per week . She reports that she does not use drugs.  Past Medical History:  Diagnosis Date  . Abnormal uterine bleeding   . Dysmenorrhea   . HSV-1 infection    fever blisters  . Vaginal yeast infection    Recurrent     Past Surgical History:  Procedure Laterality Date  . BUNIONECTOMY Right 05/2015  . INTRAUTERINE DEVICE INSERTION  09/2011   Mirena  . WISDOM TOOTH EXTRACTION      Current Outpatient Prescriptions  Medication Sig Dispense Refill  . levonorgestrel (MIRENA) 20 MCG/24HR IUD 1 each by Intrauterine route once. Reported on 05/12/2015    . valACYclovir (VALTREX) 1000 MG tablet Take 2 tablets (2000 mg) by mouth every 12 hours for 24 hours prn. 30 tablet 1   No current facility-administered medications for this visit.     Family History  Problem Relation Age of Onset  . Diabetes Mother   . Depression Mother   . Anxiety disorder Mother   . Liver disease Father   . Hypertension Father   . Cancer Father 8       prostate  .  Schizophrenia Brother   . Anxiety disorder Brother   . Depression Brother   . Breast cancer Paternal Aunt        2 paternal aunts with breast CA - BrCA negative    ROS:  Pertinent items are noted in HPI.  Otherwise, a comprehensive ROS was negative.  Exam:   BP 110/70 (BP Location: Right Arm, Patient Position: Sitting, Cuff Size: Normal)   Pulse 72   Resp 16   Ht 5' 4.75" (1.645 m)   Wt 157 lb (71.2 kg)   BMI 26.33 kg/m     General appearance: alert, cooperative and appears stated age Head: Normocephalic, without obvious abnormality, atraumatic Neck: no adenopathy, supple, symmetrical, trachea midline and thyroid normal to inspection and palpation Lungs: clear to auscultation bilaterally Breasts: normal appearance, no masses or tenderness, No nipple retraction or dimpling, No nipple discharge or bleeding, No axillary or supraclavicular adenopathy Heart: regular rate and rhythm Abdomen: soft, non-tender; no masses, no organomegaly Extremities: extremities normal, atraumatic, no cyanosis or edema Skin: Skin color, texture, turgor normal. No rashes or lesions Lymph nodes: Cervical, supraclavicular, and axillary nodes normal. No abnormal inguinal nodes palpated Neurologic: Grossly normal  Pelvic: External genitalia:  no lesions              Urethra:  normal appearing  urethra with no masses, tenderness or lesions              Bartholins and Skenes: normal                 Vagina: normal appearing vagina with normal color and discharge, no lesions              Cervix: no lesions.  IUD strings seen.              Pap taken: Yes.   Bimanual Exam:  Uterus:  normal size, contour, position, consistency, mobility, non-tender              Adnexa: no mass, fullness, tenderness              Rectal exam: Yes.  .  Confirms.              Anus:  normal sphincter tone, no lesions  Chaperone was present for exam.  Assessment:   Well woman visit with normal exam. Mirena IUD.  HSV I. FH of  breast cancer.  Negative genetic testing.   Plan: Mammogram screening discussed.  [facility] Recommended self breast awareness. Pap and HR HPV as above. Guidelines for Calcium, Vitamin D, regular exercise program including cardiovascular and weight bearing exercise. Cholesterol, CBC, CMP.  Refill of Valtrex. Follow up annually and prn.    After visit summary provided.

## 2016-11-28 ENCOUNTER — Other Ambulatory Visit: Payer: Self-pay | Admitting: Obstetrics and Gynecology

## 2016-11-28 DIAGNOSIS — Z1231 Encounter for screening mammogram for malignant neoplasm of breast: Secondary | ICD-10-CM

## 2016-11-28 LAB — COMPREHENSIVE METABOLIC PANEL
A/G RATIO: 1.8 (ref 1.2–2.2)
ALBUMIN: 4.3 g/dL (ref 3.5–5.5)
ALT: 20 IU/L (ref 0–32)
AST: 23 IU/L (ref 0–40)
Alkaline Phosphatase: 49 IU/L (ref 39–117)
BILIRUBIN TOTAL: 0.4 mg/dL (ref 0.0–1.2)
BUN / CREAT RATIO: 22 (ref 9–23)
BUN: 14 mg/dL (ref 6–24)
CALCIUM: 9.2 mg/dL (ref 8.7–10.2)
CHLORIDE: 101 mmol/L (ref 96–106)
CO2: 23 mmol/L (ref 20–29)
Creatinine, Ser: 0.64 mg/dL (ref 0.57–1.00)
GFR calc Af Amer: 129 mL/min/{1.73_m2} (ref >59)
GFR, EST NON AFRICAN AMERICAN: 112 mL/min/{1.73_m2} (ref >59)
Globulin, Total: 2.4 g/dL (ref 1.5–4.5)
Glucose: 57 mg/dL — ABNORMAL LOW (ref 65–99)
POTASSIUM: 4.5 mmol/L (ref 3.5–5.2)
Sodium: 140 mmol/L (ref 134–144)
Total Protein: 6.7 g/dL (ref 6.0–8.5)

## 2016-11-28 LAB — LIPID PANEL
CHOLESTEROL TOTAL: 179 mg/dL (ref 100–199)
Chol/HDL Ratio: 1.8 ratio (ref 0.0–4.4)
HDL: 97 mg/dL (ref >39)
LDL Calculated: 65 mg/dL (ref 0–99)
Triglycerides: 85 mg/dL (ref 0–149)
VLDL Cholesterol Cal: 17 mg/dL (ref 5–40)

## 2016-11-28 LAB — CBC
HEMOGLOBIN: 13.8 g/dL (ref 11.1–15.9)
Hematocrit: 40.3 % (ref 34.0–46.6)
MCH: 31.2 pg (ref 26.6–33.0)
MCHC: 34.2 g/dL (ref 31.5–35.7)
MCV: 91 fL (ref 79–97)
Platelets: 296 10*3/uL (ref 150–379)
RBC: 4.42 x10E6/uL (ref 3.77–5.28)
RDW: 13.5 % (ref 12.3–15.4)
WBC: 6.8 10*3/uL (ref 3.4–10.8)

## 2016-11-29 LAB — CYTOLOGY - PAP
Diagnosis: NEGATIVE
HPV: NOT DETECTED

## 2016-12-11 ENCOUNTER — Ambulatory Visit
Admission: RE | Admit: 2016-12-11 | Discharge: 2016-12-11 | Disposition: A | Discharge status: Home / Self Care | Payer: BLUE CROSS/BLUE SHIELD | Source: Ambulatory Visit | Attending: Obstetrics and Gynecology | Admitting: Obstetrics and Gynecology

## 2016-12-11 DIAGNOSIS — Z1231 Encounter for screening mammogram for malignant neoplasm of breast: Secondary | ICD-10-CM

## 2017-02-09 DIAGNOSIS — Z23 Encounter for immunization: Secondary | ICD-10-CM | POA: Diagnosis not present

## 2017-03-09 ENCOUNTER — Encounter: Payer: Self-pay | Admitting: Certified Nurse Midwife

## 2017-03-09 ENCOUNTER — Ambulatory Visit (INDEPENDENT_AMBULATORY_CARE_PROVIDER_SITE_OTHER): Payer: BLUE CROSS/BLUE SHIELD | Admitting: Certified Nurse Midwife

## 2017-03-09 VITALS — BP 100/64 | HR 68 | Resp 16 | Ht 64.75 in | Wt 158.0 lb

## 2017-03-09 DIAGNOSIS — R350 Frequency of micturition: Secondary | ICD-10-CM

## 2017-03-09 DIAGNOSIS — B3731 Acute candidiasis of vulva and vagina: Secondary | ICD-10-CM

## 2017-03-09 DIAGNOSIS — T3695XA Adverse effect of unspecified systemic antibiotic, initial encounter: Secondary | ICD-10-CM

## 2017-03-09 DIAGNOSIS — B373 Candidiasis of vulva and vagina: Secondary | ICD-10-CM | POA: Diagnosis not present

## 2017-03-09 DIAGNOSIS — B379 Candidiasis, unspecified: Secondary | ICD-10-CM

## 2017-03-09 LAB — POCT URINALYSIS DIPSTICK
Bilirubin, UA: NEGATIVE
Blood, UA: NEGATIVE
GLUCOSE UA: NEGATIVE
Ketones, UA: NEGATIVE
LEUKOCYTES UA: NEGATIVE
NITRITE UA: NEGATIVE
Protein, UA: NEGATIVE
UROBILINOGEN UA: NEGATIVE U/dL — AB
pH, UA: 5 (ref 5.0–8.0)

## 2017-03-09 MED ORDER — FLUCONAZOLE 150 MG PO TABS: ORAL_TABLET | ORAL | 0 refills | Status: DC

## 2017-03-09 NOTE — Patient Instructions (Signed)

## 2017-03-09 NOTE — Progress Notes (Signed)
41 y.o. Married Caucasian female G2P2002 here with complaint of vaginal symptoms of itching, no burning and but slight increase discharge. Describes discharge as scant with no odor..Onset of symptoms 3  days ago. Denies new personal products or vaginal dryness. no STD concerns. Urinary symptoms none now. She had frequency and pain with urination and treated with 3 days of Cipro she had on hand, and symptoms resolved. Yeast symptoms started after completion.  History of chronic yeast in past, with Diflucan better for her than cream. No other health issues today. Contraception is Mirena IUD.   POCT urine negative  ROS Pertinent to HPI   O:Healthy female WDWN Affect: normal, orientation x 3  Exam:Skin ; warm and dry CVAT bilateral negative Abdomen: non tender, negative suprapubic  inguinal Lymph nodes: no enlargement or tenderness Pelvic exam: External genital: normal female, slight increase pink on vulva area, no scaling or exudate BUS: negative Vagina: scant white, non odorous discharge noted. Some redness at entire introital area, with slight tenderness. Ph: 4.5  ,Wet prep taken, Affirm taken Cervix: normal, non tender, no CMT, IUD string noted in cervix Uterus: normal, non tender Adnexa:normal, non tender, no masses or fullness noted   Wet Prep results: Saline, KOH positive for yeast   A:Normal pelvic exam Contraception Mirena IUD Yeast vaginitis antibiotic induced Chronic history of yeast UTI symptoms resolved with self treatment with Cipro   P:Discussed findings of yeast vagintis and etiology. Discussed Aveeno or baking soda sitz bath for comfort. Avoid moist clothes for extended period of time. If working out in gym clothes for long periods of time change underwear  if possible.  Discussed good diet with limited carbohydrates with yeast history. Increase water intake to prevent further UTI symptoms. Questions addressed. Rx: Diflucan see order with instructions. Lab: Affirm due  to chronic history  Rv prn

## 2017-03-10 LAB — VAGINITIS/VAGINOSIS, DNA PROBE
Candida Species: NEGATIVE
Gardnerella vaginalis: NEGATIVE
Trichomonas vaginosis: NEGATIVE

## 2017-05-15 DIAGNOSIS — Z111 Encounter for screening for respiratory tuberculosis: Secondary | ICD-10-CM | POA: Diagnosis not present

## 2017-07-03 ENCOUNTER — Ambulatory Visit (INDEPENDENT_AMBULATORY_CARE_PROVIDER_SITE_OTHER): Payer: BLUE CROSS/BLUE SHIELD | Admitting: Family Medicine

## 2017-07-03 ENCOUNTER — Encounter: Payer: Self-pay | Admitting: Family Medicine

## 2017-07-03 VITALS — BP 120/60 | HR 50 | Temp 97.5°F | Wt 157.4 lb

## 2017-07-03 DIAGNOSIS — Z8619 Personal history of other infectious and parasitic diseases: Secondary | ICD-10-CM | POA: Insufficient documentation

## 2017-07-03 DIAGNOSIS — Z975 Presence of (intrauterine) contraceptive device: Secondary | ICD-10-CM | POA: Diagnosis not present

## 2017-07-03 DIAGNOSIS — Z111 Encounter for screening for respiratory tuberculosis: Secondary | ICD-10-CM

## 2017-07-03 DIAGNOSIS — H6122 Impacted cerumen, left ear: Secondary | ICD-10-CM | POA: Diagnosis not present

## 2017-07-03 DIAGNOSIS — Z114 Encounter for screening for human immunodeficiency virus [HIV]: Secondary | ICD-10-CM | POA: Diagnosis not present

## 2017-07-03 DIAGNOSIS — Z0000 Encounter for general adult medical examination without abnormal findings: Secondary | ICD-10-CM

## 2017-07-03 DIAGNOSIS — Z0001 Encounter for general adult medical examination with abnormal findings: Secondary | ICD-10-CM | POA: Diagnosis not present

## 2017-07-03 NOTE — Progress Notes (Signed)
Subjective:    Lydia Torres is a 42 y.o. female and is here for a comprehensive physical exam.  Pertinent Gynecological History: No LMP recorded. Patient is not currently having periods (Reason: IUD).  Health Maintenance Due  Topic Date Due  . HIV Screening  11/27/1991  . TETANUS/TDAP  11/27/1995   PMHx, SurgHx, SocialHx, Medications, and Allergies were reviewed in the Visit Navigator and updated as appropriate.   Past Medical History:  Diagnosis Date  . Abnormal uterine bleeding   . Dysmenorrhea   . HSV-1 infection   . Recurrent vaginal yeast infection    Past Surgical History:  Procedure Laterality Date  . BUNIONECTOMY Right 05/2015  . INTRAUTERINE DEVICE INSERTION  09/2011   2013 Mirena removed & re-inserted 06-12-16  . WISDOM TOOTH EXTRACTION     Family History  Problem Relation Age of Onset  . Diabetes Mother   . Depression Mother   . Anxiety disorder Mother   . Liver disease Father   . Hypertension Father   . Prostate cancer Father 63  . Schizophrenia Brother   . Anxiety disorder Brother   . Depression Brother   . Breast cancer Paternal Aunt        2 paternal aunts with breast CA - BrCA negative  . Breast cancer Paternal Aunt    Social History   Tobacco Use  . Smoking status: Never Smoker  . Smokeless tobacco: Never Used  Substance Use Topics  . Alcohol use: Yes    Alcohol/week: 1.8 - 2.4 oz    Types: 3 - 4 Glasses of wine per week  . Drug use: Yes    Types: Anabolic steroids   Review of Systems:   Pertinent items are noted in the HPI. Otherwise, ROS is negative.  Objective:   BP 120/60   Pulse (!) 50   Temp (!) 97.5 F (36.4 C) (Oral)   Wt 157 lb 6.4 oz (71.4 kg)   SpO2 100%   BMI 26.40 kg/m    Wt Readings from Last 3 Encounters:  07/03/17 157 lb 6.4 oz (71.4 kg)  03/09/17 158 lb (71.7 kg)  11/27/16 157 lb (71.2 kg)     Ht Readings from Last 3 Encounters:  03/09/17 5' 4.75" (1.645 m)  11/27/16 5' 4.75" (1.645 m)  06/12/16 5' 4.5"  (1.638 m)   General appearance: alert, cooperative and appears stated age. Head: normocephalic, without obvious abnormality, atraumatic. Neck: no adenopathy, supple, symmetrical, trachea midline; thyroid not enlarged, symmetric, no tenderness/mass/nodules. Lungs: clear to auscultation bilaterally. Heart: regular rate and rhythm Abdomen: soft, non-tender; no masses,  no organomegaly. Extremities: extremities normal, atraumatic, no cyanosis or edema. Skin: skin color, texture, turgor normal, no rashes or lesions. Lymph: cervical, supraclavicular, and axillary nodes normal; no abnormal inguinal nodes palpated. Neurologic: grossly normal.  Assessment/Plan:   Lydia Torres was seen today for establish care.  Diagnoses and all orders for this visit:  Routine physical examination  IUD (intrauterine device) in place, Mirena inserted 06/12/16  Screening for HIV (human immunodeficiency virus) -     HIV antibody; Future  Screening-pulmonary TB -     QuantiFERON-TB Gold Plus; Future  Left ear impacted cerumen Comments:  PROCEDURE: CERUMEN DISIMPACTION    The patient had a large amount of cerumen in the external auditory canal(s): left  Ear wax softener was used prior to the lavage.  Ear lavage was performed on left ear(s) by Lonell Grandchild.  Curettage by provider was not performed in addition.   There were  no complications and following the disimpaction the tympanic membrane was visible.  Charges to be entered in Charge Capture section.  Patient Counseling: [x]    Nutrition: Stressed importance of moderation in sodium/caffeine intake, saturated fat and cholesterol, caloric balance, sufficient intake of fresh fruits, vegetables, fiber, calcium, iron, and 1 mg of folate supplement per day (for females capable of pregnancy).  [x]    Stressed the importance of regular exercise.   [x]    Substance Abuse: Discussed cessation/primary prevention of tobacco, alcohol, or other drug use; driving or  other dangerous activities under the influence; availability of treatment for abuse.   [x]    Injury prevention: Discussed safety belts, safety helmets, smoke detector, smoking near bedding or upholstery.   [x]    Sexuality: Discussed sexually transmitted diseases, partner selection, use of condoms, avoidance of unintended pregnancy  and contraceptive alternatives.  [x]    Dental health: Discussed importance of regular tooth brushing, flossing, and dental visits.  [x]    Health maintenance and immunizations reviewed. Please refer to Health maintenance section.   Briscoe Deutscher, DO [place]

## 2017-07-04 ENCOUNTER — Encounter: Payer: Self-pay | Admitting: Surgical

## 2017-07-06 ENCOUNTER — Other Ambulatory Visit: Payer: Self-pay | Admitting: Family Medicine

## 2017-07-06 DIAGNOSIS — Z111 Encounter for screening for respiratory tuberculosis: Secondary | ICD-10-CM

## 2017-07-06 DIAGNOSIS — Z114 Encounter for screening for human immunodeficiency virus [HIV]: Secondary | ICD-10-CM

## 2017-07-12 ENCOUNTER — Other Ambulatory Visit (INDEPENDENT_AMBULATORY_CARE_PROVIDER_SITE_OTHER): Payer: BLUE CROSS/BLUE SHIELD

## 2017-07-12 DIAGNOSIS — Z114 Encounter for screening for human immunodeficiency virus [HIV]: Secondary | ICD-10-CM

## 2017-07-12 DIAGNOSIS — Z111 Encounter for screening for respiratory tuberculosis: Secondary | ICD-10-CM | POA: Diagnosis not present

## 2017-07-13 LAB — HIV ANTIBODY (ROUTINE TESTING W REFLEX): HIV 1&2 Ab, 4th Generation: NONREACTIVE

## 2017-07-14 LAB — QUANTIFERON-TB GOLD PLUS
Mitogen-NIL: >10 IU/mL
NIL: 0.02 IU/mL
QuantiFERON-TB Gold Plus: NEGATIVE
TB1-NIL: 0.02 IU/mL
TB2-NIL: 0.01 IU/mL

## 2017-07-18 ENCOUNTER — Telehealth: Payer: Self-pay | Admitting: Family Medicine

## 2017-07-18 NOTE — Telephone Encounter (Signed)
Patient would like to come by the office to pick up a copy of her lab results for her QuantiFERON-TB Gold Plus when they are available. She needs the results for her job.   Please contact patient once lab results are ready for pick up at the front office.

## 2017-07-18 NOTE — Telephone Encounter (Signed)
Called pt l/m to let her know at reception for pick up.

## 2017-10-23 ENCOUNTER — Ambulatory Visit: Payer: Self-pay | Admitting: Family Medicine

## 2017-10-23 ENCOUNTER — Encounter: Payer: Self-pay | Admitting: Physician Assistant

## 2017-10-23 ENCOUNTER — Ambulatory Visit (INDEPENDENT_AMBULATORY_CARE_PROVIDER_SITE_OTHER): Payer: BLUE CROSS/BLUE SHIELD | Admitting: Physician Assistant

## 2017-10-23 VITALS — BP 120/84 | HR 53 | Temp 98.8°F | Ht 64.75 in | Wt 156.0 lb

## 2017-10-23 DIAGNOSIS — R42 Dizziness and giddiness: Secondary | ICD-10-CM

## 2017-10-23 LAB — POCT URINE PREGNANCY: Preg Test, Ur: NEGATIVE

## 2017-10-23 NOTE — Progress Notes (Signed)
Lydia Torres is a 42 y.o. female here for a new problem.  I acted as a Education administrator for [employer], PA-C  Anselmo Pickler, LPN  History of Present Illness:   Chief Complaint  Patient presents with  . Dizziness    Dizziness  This is a new problem. Episode onset: Started around 9:30 PM last night. was reading a book in bed to her daughter lying down and when she went to get up had dizziness, sat back down and tried to do exercises that she did for Vertigo 10 years ago and it made her worse and vomited.  Episode frequency: came on all of a sudden last night did have headache dull at base of skull prior to dizziness. Progression since onset: Dizziness is gone today, but does not feel right, feels jittery, off balance, no appetite, feels like she is in a fog. Associated symptoms include headaches (last night for about an hour), nausea (last night) and vomiting (last night x 1). Pertinent negatives include no abdominal pain, change in bowel habit, chills, diaphoresis, fatigue, fever, neck pain or sore throat. The symptoms are aggravated by twisting, bending and standing. She has tried acetaminophen, lying down and rest for the symptoms. The treatment provided significant (Tylenol took headache away) relief.   Feels unsteady, woozy, no appetite but hungry, feels like she is in a fog. No more dizziness.  She has had vertigo in the past and states that her episodes felt just like this. Also, she had a dull HA prior to this, she states that she has a history of migraines, gets them about once a year.  She does feel like the past times when she did have a migraine she had associated nausea with it.   Past Medical History:  Diagnosis Date  . Abnormal uterine bleeding   . Chicken pox   . Dysmenorrhea   . HSV-1 infection   . Recurrent vaginal yeast infection   . Seasonal allergies      Social History   Socioeconomic History  . Marital status: Married    Spouse name: Not on file  . Number of  children: Not on file  . Years of education: Not on file  . Highest education level: Not on file  Occupational History    Employer: [employer]  Social Needs  . Financial resource strain: Not on file  . Food insecurity:    Worry: Not on file    Inability: Not on file  . Transportation needs:    Medical: Not on file    Non-medical: Not on file  Tobacco Use  . Smoking status: Never Smoker  . Smokeless tobacco: Never Used  Substance and Sexual Activity  . Alcohol use: Yes    Alcohol/week: 1.8 - 2.4 oz    Types: 3 - 4 Glasses of wine per week  . Drug use: Yes    Types: Anabolic steroids  . Sexual activity: Yes    Partners: Male    Birth control/protection: IUD  Lifestyle  . Physical activity:    Days per week: Not on file    Minutes per session: Not on file  . Stress: Not on file  Relationships  . Social connections:    Talks on phone: Not on file    Gets together: Not on file    Attends religious service: Not on file    Active member of club or organization: Not on file    Attends meetings of clubs or organizations: Not on file  Relationship status: Not on file  . Intimate partner violence:    Fear of current or ex partner: Not on file    Emotionally abused: Not on file    Physically abused: Not on file    Forced sexual activity: Not on file  Other Topics Concern  . Not on file  Social History Narrative  . Not on file    Past Surgical History:  Procedure Laterality Date  . BUNIONECTOMY Right 05/2015  . INTRAUTERINE DEVICE INSERTION  09/2011   2013 Mirena removed & re-inserted 06-12-16  . WISDOM TOOTH EXTRACTION      Family History  Problem Relation Age of Onset  . Diabetes Mother   . Depression Mother   . Anxiety disorder Mother   . Liver disease Father   . Hypertension Father   . Prostate cancer Father 77  . Schizophrenia Brother   . Anxiety disorder Brother   . Depression Brother   . Breast cancer Paternal Aunt        2 paternal aunts with breast CA -  BrCA negative  . Breast cancer Paternal Aunt     Allergies  Allergen Reactions  . Oxycodone   . Codeine Rash  . Phenergan [Promethazine Hcl] Rash  . Sulfa Antibiotics Rash    Current Medications:   Current Outpatient Medications:  .  levonorgestrel (MIRENA) 20 MCG/24HR IUD, 1 each by Intrauterine route once. Reported on 05/12/2015, Disp: , Rfl:  .  valACYclovir (VALTREX) 1000 MG tablet, Take 2 tablets (2000 mg) by mouth every 12 hours for 24 hours prn., Disp: 30 tablet, Rfl: 1   Review of Systems:   Review of Systems  Constitutional: Negative for chills, diaphoresis, fatigue and fever.  HENT: Negative for sore throat.   Gastrointestinal: Positive for nausea (last night) and vomiting (last night x 1). Negative for abdominal pain and change in bowel habit.  Musculoskeletal: Negative for neck pain.  Neurological: Positive for dizziness and headaches (last night for about an hour).    Vitals:   Vitals:   10/23/17 1455  BP: 120/84  Pulse: (!) 53  Temp: 98.8 F (37.1 C)  TempSrc: Oral  SpO2: 99%  Weight: 156 lb (70.8 kg)  Height: 5' 4.75" (1.645 m)     Body mass index is 26.16 kg/m.  Physical Exam:   Physical Exam  Constitutional: She appears well-developed. She is cooperative.  Non-toxic appearance. She does not have a sickly appearance. She does not appear ill. No distress.  HENT:  Head: Normocephalic and atraumatic.  Right Ear: Tympanic membrane, external ear and ear canal normal. Tympanic membrane is not erythematous, not retracted and not bulging.  Left Ear: Tympanic membrane, external ear and ear canal normal. Tympanic membrane is not erythematous, not retracted and not bulging.  Nose: Nose normal.  Mouth/Throat: Uvula is midline, oropharynx is clear and moist and mucous membranes are normal.  Eyes: Pupils are equal, round, and reactive to light. Conjunctivae, EOM and lids are normal.  Cardiovascular: Normal rate, regular rhythm, S1 normal, S2 normal, normal  heart sounds and normal pulses.  No LE edema  Pulmonary/Chest: Effort normal and breath sounds normal.  Neurological: She is alert. She has normal strength. No cranial nerve deficit or sensory deficit. Coordination and gait normal. GCS eye subscore is 4. GCS verbal subscore is 5. GCS motor subscore is 6.  Skin: Skin is warm, dry and intact.  Psychiatric: She has a normal mood and affect. Her speech is normal and behavior is normal.  Nursing note and vitals reviewed.  Results for orders placed or performed in visit on 10/23/17  POCT urine pregnancy  Result Value Ref Range   Preg Test, Ur Negative Negative   Dix-Hallpike negative bilaterally  Assessment and Plan:    Jerrie was seen today for dizziness.  Diagnoses and all orders for this visit:  Dizziness Symptoms essentially resolved except for residual feelings of fatigue and jitteriness.  She does have a history of low blood sugar, I recommended that she eat small frequent meals with adequate sources of carbohydrate and protein and stay well-hydrated.  Neuro exam was completely benign.  No red flags on her exam today.  Offered antiemetic however patient declined.  Urine pregnancy test is negative.  Will check labs.  If symptoms do not improve, consider imaging or further work-up.  Patient is agreeable to plan.  Briefly discussed with PCP Dr. Briscoe Deutscher. -     CBC -     Comprehensive metabolic panel -     POCT urine pregnancy    . Reviewed expectations re: course of current medical issues. . Discussed self-management of symptoms. . Outlined signs and symptoms indicating need for more acute intervention. . Patient verbalized understanding and all questions were answered. . See orders for this visit as documented in the electronic medical record. . Patient received an After-Visit Summary.  CMA or LPN served as scribe during this visit. History, Physical, and Plan performed by medical provider. Documentation and orders reviewed and  attested to.   Inda Coke, PA-C

## 2017-10-23 NOTE — Telephone Encounter (Signed)
Appointment made for today with Jarold MottoSamantha Worley for OV  ok per Rolly SalterHaley at the practice . Instructions and advice given.  Reason for Disposition . [1] MODERATE dizziness (e.g., interferes with normal activities) AND [2] has been evaluated by physician for this  Answer Assessment - Initial Assessment Questions 1. DESCRIPTION: "Describe your dizziness."       Last night - got out of bed  Got dizzy and  Vomited x 3 or 4 times   2. LIGHTHEADED: "Do you feel lightheaded?" (e.g., somewhat faint, woozy, weak upon standing)          Not lightheaded at this time    3. VERTIGO: "Do you feel like either you or the room is spinning or tilting?" (i.e. vertigo)      Room spinning last night  - tilting   4. SEVERITY: "How bad is it?"  "Do you feel like you are going to faint?" "Can you stand and walk?"   - MILD - walking normally   - MODERATE - interferes with normal activities (e.g., work, school)    - SEVERE - unable to stand, requires support to walk, feels like passing out now.       Feels  Fine now  Just does not fell right  Stomach feels queezie 5. ONSET:  "When did the dizziness begin?"      Last evening  6. AGGRAVATING FACTORS: "Does anything make it worse?" (e.g., standing, change in head position)       Head position aggravated symptoms   7. HEART RATE: "Can you tell me your heart rate?" "How many beats in 15 seconds?"  (Note: not all patients can do this)           Unable at this time   8. CAUSE: "What do you think is causing the dizziness?"           Assumes it is vertigo  That she has had an   9. RECURRENT SYMPTOM: "Have you had dizziness before?" If so, ask: "When was the last time?" "What happened that time?"     10 years  10. OTHER SYMPTOMS: "Do you have any other symptoms?" (e.g., fever, chest pain, vomiting, diarrhea, bleeding)         Vomiting  Had headache yesterday  relieved by tylenol   11. PREGNANCY: "Is there any chance you are pregnant?" "When was your last menstrual period?"    IUD  Protocols used: DIZZINESS [facility]- LIGHTHEADEDNESS-A-AH

## 2017-10-23 NOTE — Patient Instructions (Signed)
It was great to see you!  If your symptoms persist, please let us know. If any stroke like symptoms (vision changes, weakness on one side of the body, severe sudden headache, slurred speech --> go to the ER.)  Keep hydrated and eat small/frequent meals with adequate carbohydrate and protein to maintain normal blood sugars.  We will contact you with your lab results.

## 2017-10-24 LAB — COMPREHENSIVE METABOLIC PANEL
ALBUMIN: 4.4 g/dL (ref 3.5–5.2)
ALT: 23 U/L (ref 0–35)
AST: 23 U/L (ref 0–37)
Alkaline Phosphatase: 43 U/L (ref 39–117)
BUN: 16 mg/dL (ref 6–23)
CHLORIDE: 102 meq/L (ref 96–112)
CO2: 29 meq/L (ref 19–32)
Calcium: 9.7 mg/dL (ref 8.4–10.5)
Creatinine, Ser: 0.83 mg/dL (ref 0.40–1.20)
GFR: 80.56 mL/min (ref >60.00)
Glucose, Bld: 80 mg/dL (ref 70–99)
POTASSIUM: 4.2 meq/L (ref 3.5–5.1)
Sodium: 140 mEq/L (ref 135–145)
Total Bilirubin: 0.5 mg/dL (ref 0.2–1.2)
Total Protein: 7.1 g/dL (ref 6.0–8.3)

## 2017-10-24 LAB — CBC
HEMATOCRIT: 41.8 % (ref 36.0–46.0)
HEMOGLOBIN: 14.4 g/dL (ref 12.0–15.0)
MCHC: 34.3 g/dL (ref 30.0–36.0)
MCV: 90.8 fl (ref 78.0–100.0)
Platelets: 350 10*3/uL (ref 150.0–400.0)
RBC: 4.6 Mil/uL (ref 3.87–5.11)
RDW: 13 % (ref 11.5–15.5)
WBC: 6.7 10*3/uL (ref 4.0–10.5)

## 2017-11-14 ENCOUNTER — Other Ambulatory Visit: Payer: Self-pay | Admitting: Obstetrics and Gynecology

## 2017-11-14 DIAGNOSIS — Z1231 Encounter for screening mammogram for malignant neoplasm of breast: Secondary | ICD-10-CM

## 2017-11-30 ENCOUNTER — Ambulatory Visit: Payer: BLUE CROSS/BLUE SHIELD | Admitting: Obstetrics and Gynecology

## 2017-12-05 NOTE — Progress Notes (Signed)
42 y.o. G37P2002 Married Caucasian female here for annual exam.    Pleased with her IUD.  Since her IUD was changed, she has no periods, PMS, or skin acne.  Tenderness in her right breast for 7 years.   Labs done with PCP in June due to dizziness and HA.  Resolved after 24 hours.   PCP:   Briscoe Deutscher, DO  No LMP recorded. (Menstrual status: IUD).           Sexually active: Yes.    The current method of family planning is IUD.   Mirena placed on 06/12/16.  Exercising: Yes.    running and boot camp Smoker:  no  Health Maintenance: Pap:  11/27/2016 negative, neg HR HPV. History of abnormal Pap:  No. MMG:  12/11/2016 birads 1;negative schedule 12/12/2017 for follow up Colonoscopy:  n/a BMD:   n/a  Result  n/a TDaP:  2015 Gardasil:   No. HIV: done in pregnancy Hep C: done in pregnancy Screening Labs: PCP does  Hb today: n/a, Urine today: n/a   reports that she has never smoked. She has never used smokeless tobacco. She reports that she drinks about 3.0 - 4.0 standard drinks of alcohol per week. She reports that she has current or past drug history. Drug: Anabolic steroids.  Past Medical History:  Diagnosis Date  . Abnormal uterine bleeding   . Chicken pox   . Dysmenorrhea   . HSV-1 infection   . Recurrent vaginal yeast infection   . Seasonal allergies     Past Surgical History:  Procedure Laterality Date  . BUNIONECTOMY Right 05/2015  . INTRAUTERINE DEVICE INSERTION  09/2011   2013 Mirena removed & re-inserted 06-12-16  . WISDOM TOOTH EXTRACTION      Current Outpatient Medications  Medication Sig Dispense Refill  . levonorgestrel (MIRENA) 20 MCG/24HR IUD 1 each by Intrauterine route once. Reported on 05/12/2015    . valACYclovir (VALTREX) 1000 MG tablet Take 2 tablets (2000 mg) by mouth every 12 hours for 24 hours prn. 30 tablet 1   No current facility-administered medications for this visit.     Family History  Problem Relation Age of Onset  . Diabetes Mother    . Depression Mother   . Anxiety disorder Mother   . Liver disease Father   . Hypertension Father   . Prostate cancer Father 36  . Schizophrenia Brother   . Anxiety disorder Brother   . Depression Brother   . Breast cancer Paternal Aunt        2 paternal aunts with breast CA - BrCA negative  . Breast cancer Paternal Aunt     Review of Systems  Constitutional: Negative.   HENT: Negative.   Eyes: Negative.   Respiratory: Negative.   Cardiovascular: Negative.   Gastrointestinal: Negative.   Endocrine: Negative.   Genitourinary: Negative.        Loss of urination when sneezing or cough  Musculoskeletal: Negative.   Skin: Negative.   Allergic/Immunologic: Negative.   Neurological: Negative.   Hematological: Negative.   Psychiatric/Behavioral: Negative.   All other systems reviewed and are negative.   Exam:   BP 116/68 (BP Location: Left Arm, Patient Position: Sitting)   Pulse 68   Resp 16   Ht 5' 4.75" (1.645 m)   Wt 154 lb (69.9 kg)   BMI 25.83 kg/m     General appearance: alert, cooperative and appears stated age Head: Normocephalic, without obvious abnormality, atraumatic Neck: no adenopathy, supple, symmetrical, trachea midline  and thyroid normal to inspection and palpation Lungs: clear to auscultation bilaterally Breasts: normal appearance, no masses or tenderness, No nipple retraction or dimpling, No nipple discharge or bleeding, No axillary or supraclavicular adenopathy Heart: regular rate and rhythm Abdomen: soft, non-tender; no masses, no organomegaly Extremities: extremities normal, atraumatic, no cyanosis or edema Skin: Skin color, texture, turgor normal. No rashes or lesions Lymph nodes: Cervical, supraclavicular, and axillary nodes normal. No abnormal inguinal nodes palpated Neurologic: Grossly normal  Pelvic: External genitalia:  no lesions              Urethra:  normal appearing urethra with no masses, tenderness or lesions              Bartholins and  Skenes: normal                 Vagina: normal appearing vagina with normal color and discharge, no lesions              Cervix: no lesions. IUD strings noted.               Pap taken: No. Bimanual Exam:  Uterus:  normal size, contour, position, consistency, mobility, non-tender              Adnexa: no mass, fullness, tenderness              Rectal exam: Yes.  .  Confirms.              Anus:  normal sphincter tone, no lesions  Chaperone was present for exam.  Assessment:   Well woman visit with normal exam. Mirena IUD.  HSV I. FH of breast cancer.  Negative genetic testing.  Right breast pain.   Plan: Mammogram dx bilateral and right breast US.  Recommended self breast awareness. Pap and HR HPV as above. Guidelines for Calcium, Vitamin D, regular exercise program including cardiovascular and weight bearing exercise. Discussed Gardasil.   Rx for Valtrex.  Follow up annually and prn.   After visit summary provided.

## 2017-12-06 ENCOUNTER — Ambulatory Visit (INDEPENDENT_AMBULATORY_CARE_PROVIDER_SITE_OTHER): Payer: BLUE CROSS/BLUE SHIELD | Admitting: Obstetrics and Gynecology

## 2017-12-06 ENCOUNTER — Other Ambulatory Visit: Payer: Self-pay

## 2017-12-06 ENCOUNTER — Encounter: Payer: Self-pay | Admitting: Obstetrics and Gynecology

## 2017-12-06 VITALS — BP 116/68 | HR 68 | Resp 16 | Ht 64.75 in | Wt 154.0 lb

## 2017-12-06 DIAGNOSIS — Z01419 Encounter for gynecological examination (general) (routine) without abnormal findings: Secondary | ICD-10-CM

## 2017-12-06 DIAGNOSIS — N644 Mastodynia: Secondary | ICD-10-CM

## 2017-12-06 MED ORDER — VALACYCLOVIR HCL 1 G PO TABS: ORAL_TABLET | ORAL | 1 refills | Status: DC

## 2017-12-06 NOTE — Patient Instructions (Signed)
EXERCISE AND DIET:  We recommended that you start or continue a regular exercise program for good health. Regular exercise means any activity that makes your heart beat faster and makes you sweat.  We recommend exercising at least 30 minutes per day at least 3 days a week, preferably 4 or 5.  We also recommend a diet low in fat and sugar.  Inactivity, poor dietary choices and obesity can cause diabetes, heart attack, stroke, and kidney damage, among others.    ALCOHOL AND SMOKING:  Women should limit their alcohol intake to no more than 7 drinks/beers/glasses of wine (combined, not each!) per week. Moderation of alcohol intake to this level decreases your risk of breast cancer and liver damage. And of course, no recreational drugs are part of a healthy lifestyle.  And absolutely no smoking or even second hand smoke. Most people know smoking can cause heart and lung diseases, but did you know it also contributes to weakening of your bones? Aging of your skin?  Yellowing of your teeth and nails?  CALCIUM AND VITAMIN D:  Adequate intake of calcium and Vitamin D are recommended.  The recommendations for exact amounts of these supplements seem to change often, but generally speaking 600 mg of calcium (either carbonate or citrate) and 800 units of Vitamin D per day seems prudent. Certain women may benefit from higher intake of Vitamin D.  If you are among these women, your doctor will have told you during your visit.    PAP SMEARS:  Pap smears, to check for cervical cancer or precancers,  have traditionally been done yearly, although recent scientific advances have shown that most women can have pap smears less often.  However, every woman still should have a physical exam from her gynecologist every year. It will include a breast check, inspection of the vulva and vagina to check for abnormal growths or skin changes, a visual exam of the cervix, and then an exam to evaluate the size and shape of the uterus and  ovaries.  And after 40 years of age, a rectal exam is indicated to check for rectal cancers. We will also provide age appropriate advice regarding health maintenance, like when you should have certain vaccines, screening for sexually transmitted diseases, bone density testing, colonoscopy, mammograms, etc.   MAMMOGRAMS:  All women over 40 years old should have a yearly mammogram. Many facilities now offer a "3D" mammogram, which may cost around $50 extra out of pocket. If possible,  we recommend you accept the option to have the 3D mammogram performed.  It both reduces the number of women who will be called back for extra views which then turn out to be normal, and it is better than the routine mammogram at detecting truly abnormal areas.    COLONOSCOPY:  Colonoscopy to screen for colon cancer is recommended for all women at age 50.  We know, you hate the idea of the prep.  We agree, BUT, having colon cancer and not knowing it is worse!!  Colon cancer so often starts as a polyp that can be seen and removed at colonscopy, which can quite literally save your life!  And if your first colonoscopy is normal and you have no family history of colon cancer, most women don't have to have it again for 10 years.  Once every ten years, you can do something that may end up saving your life, right?  We will be happy to help you get it scheduled when you are ready.    Be sure to check your insurance coverage so you understand how much it will cost.  It may be covered as a preventative service at no cost, but you should check your particular policy.     HPV (Human Papillomavirus) Vaccine: What You Need to Know 1. Why get vaccinated? HPV vaccine prevents infection with human papillomavirus (HPV) types that are associated with many cancers, including:  cervical cancer in females,  vaginal and vulvar cancers in females,  anal cancer in females and males,  throat cancer in females and males, and  penile cancer in  males.  In addition, HPV vaccine prevents infection with HPV types that cause genital warts in both females and males. In the [place] about 12,000 women get cervical cancer every year, and about 4,000 women die from it. HPV vaccine can prevent most of these cases of cervical cancer. Vaccination is not a substitute for cervical cancer screening. This vaccine does not protect against all HPV types that can cause cervical cancer. Women should still get regular Pap tests. HPV infection usually comes from sexual contact, and most people will become infected at some point in their life. About 14 million Americans, including teens, get infected every year. Most infections will go away on their own and not cause serious problems. But thousands of women and men get cancer and other diseases from HPV. 2. HPV vaccine HPV vaccine is approved by FDA and is recommended by CDC for both males and females. It is routinely given at 11 or 41 years of age, but it may be given beginning at age 9 years through age 26 years. Most adolescents 9 through 41 years of age should get HPV vaccine as a two-dose series with the doses separated by 6-12 months. People who start HPV vaccination at 15 years of age and older should get the vaccine as a three-dose series with the second dose given 1-2 months after the first dose and the third dose given 6 months after the first dose. There are several exceptions to these age recommendations. Your health care provider can give you more information. 3. Some people should not get this vaccine  Anyone who has had a severe (life-threatening) allergic reaction to a dose of HPV vaccine should not get another dose.  Anyone who has a severe (life threatening) allergy to any component of HPV vaccine should not get the vaccine.  Tell your doctor if you have any severe allergies that you know of, including a severe allergy to yeast.  HPV vaccine is not recommended for pregnant women. If you learn  that you were pregnant when you were vaccinated, there is no reason to expect any problems for you or your baby. Any woman who learns she was pregnant when she got HPV vaccine is encouraged to contact the manufacturer's registry for HPV vaccination during pregnancy at 1-800-986-8999. Women who are breastfeeding may be vaccinated.  If you have a mild illness, such as a cold, you can probably get the vaccine today. If you are moderately or severely ill, you should probably wait until you recover. Your doctor can advise you. 4. Risks of a vaccine reaction With any medicine, including vaccines, there is a chance of side effects. These are usually mild and go away on their own, but serious reactions are also possible. Most people who get HPV vaccine do not have any serious problems with it. Mild or moderate problems following HPV vaccine:  Reactions in the arm where the shot was given: ? Soreness (about   9 people in 10) ? Redness or swelling (about 1 person in 3)  Fever: ? Mild (100F) (about 1 person in 10) ? Moderate (102F) (about 1 person in 65)  Other problems: ? Headache (about 1 person in 3) Problems that could happen after any injected vaccine:  People sometimes faint after a medical procedure, including vaccination. Sitting or lying down for about 15 minutes can help prevent fainting, and injuries caused by a fall. Tell your doctor if you feel dizzy, or have vision changes or ringing in the ears.  Some people get severe pain in the shoulder and have difficulty moving the arm where a shot was given. This happens very rarely.  Any medication can cause a severe allergic reaction. Such reactions from a vaccine are very rare, estimated at about 1 in a million doses, and would happen within a few minutes to a few hours after the vaccination. As with any medicine, there is a very remote chance of a vaccine causing a serious injury or death. The safety of vaccines is always being monitored. For  more information, visit: www.cdc.gov/vaccinesafety/. 5. What if there is a serious reaction? What should I look for? Look for anything that concerns you, such as signs of a severe allergic reaction, very high fever, or unusual behavior. Signs of a severe allergic reaction can include hives, swelling of the face and throat, difficulty breathing, a fast heartbeat, dizziness, and weakness. These would usually start a few minutes to a few hours after the vaccination. What should I do? If you think it is a severe allergic reaction or other emergency that can't wait, call 9-1-1 or get to the nearest hospital. Otherwise, call your doctor. Afterward, the reaction should be reported to the Vaccine Adverse Event Reporting System (VAERS). Your doctor should file this report, or you can do it yourself through the VAERS web site at www.vaers.hhs.gov, or by calling 1-800-822-7967. VAERS does not give medical advice. 6. The National Vaccine Injury Compensation Program The National Vaccine Injury Compensation Program (VICP) is a federal program that was created to compensate people who may have been injured by certain vaccines. Persons who believe they may have been injured by a vaccine can learn about the program and about filing a claim by calling 1-800-338-2382 or visiting the VICP website at www.hrsa.gov/vaccinecompensation. There is a time limit to file a claim for compensation. 7. How can I learn more?  Ask your health care provider. He or she can give you the vaccine package insert or suggest other sources of information.  Call your local or state health department.  Contact the Centers for Disease Control and Prevention (CDC): ? Call 1-800-232-4636 (1-800-CDC-INFO) or ? Visit CDC's website at www.cdc.gov/hpv Vaccine Information Statement, HPV Vaccine (04/02/2015) This information is not intended to replace advice given to you by your health care provider. Make sure you discuss any questions you have  with your health care provider. Document Released: 11/12/2013 Document Revised: 01/06/2016 Document Reviewed: 01/06/2016 Elsevier Interactive Patient Education  2017 Elsevier Inc.  

## 2017-12-06 NOTE — Progress Notes (Signed)
Patient scheduled while in office for bilateral Dx MMG and right breast US, if needed, at [facility] on 12/13/17 at 10:10am. Screening MMG cancelled for 8/14. Patient verbalizes understanding and is agreeable.

## 2017-12-12 ENCOUNTER — Ambulatory Visit: Payer: BLUE CROSS/BLUE SHIELD

## 2017-12-13 ENCOUNTER — Other Ambulatory Visit: Payer: BLUE CROSS/BLUE SHIELD

## 2017-12-26 ENCOUNTER — Ambulatory Visit
Admission: RE | Admit: 2017-12-26 | Discharge: 2017-12-26 | Disposition: A | Discharge status: Home / Self Care | Payer: BLUE CROSS/BLUE SHIELD | Source: Ambulatory Visit | Attending: Obstetrics and Gynecology | Admitting: Obstetrics and Gynecology

## 2017-12-26 ENCOUNTER — Ambulatory Visit: Payer: BLUE CROSS/BLUE SHIELD

## 2017-12-26 DIAGNOSIS — R922 Inconclusive mammogram: Secondary | ICD-10-CM | POA: Diagnosis not present

## 2017-12-26 DIAGNOSIS — N644 Mastodynia: Secondary | ICD-10-CM

## 2018-01-22 DIAGNOSIS — M79642 Pain in left hand: Secondary | ICD-10-CM | POA: Diagnosis not present

## 2018-01-22 DIAGNOSIS — M2242 Chondromalacia patellae, left knee: Secondary | ICD-10-CM | POA: Diagnosis not present

## 2018-01-22 DIAGNOSIS — M79641 Pain in right hand: Secondary | ICD-10-CM | POA: Diagnosis not present

## 2018-01-22 DIAGNOSIS — M25562 Pain in left knee: Secondary | ICD-10-CM | POA: Diagnosis not present

## 2018-02-14 ENCOUNTER — Ambulatory Visit (INDEPENDENT_AMBULATORY_CARE_PROVIDER_SITE_OTHER): Payer: BLUE CROSS/BLUE SHIELD | Admitting: *Deleted

## 2018-02-14 ENCOUNTER — Encounter: Payer: Self-pay | Admitting: Family Medicine

## 2018-02-14 DIAGNOSIS — Z23 Encounter for immunization: Secondary | ICD-10-CM | POA: Diagnosis not present

## 2018-03-06 ENCOUNTER — Other Ambulatory Visit: Payer: Self-pay | Admitting: Family Medicine

## 2018-03-06 MED ORDER — AZITHROMYCIN 250 MG PO TABS: ORAL_TABLET | ORAL | 0 refills | Status: DC

## 2018-03-06 MED ORDER — PREDNISONE 5 MG PO TABS: ORAL_TABLET | ORAL | 0 refills | Status: DC

## 2018-07-28 ENCOUNTER — Encounter: Payer: Self-pay | Admitting: Family Medicine

## 2018-07-30 ENCOUNTER — Encounter: Payer: Self-pay | Admitting: Family Medicine

## 2018-07-30 ENCOUNTER — Other Ambulatory Visit: Payer: Self-pay

## 2018-07-30 ENCOUNTER — Ambulatory Visit (INDEPENDENT_AMBULATORY_CARE_PROVIDER_SITE_OTHER): Payer: BLUE CROSS/BLUE SHIELD | Admitting: Family Medicine

## 2018-07-30 VITALS — Ht 64.75 in

## 2018-07-30 DIAGNOSIS — R2241 Localized swelling, mass and lump, right lower limb: Secondary | ICD-10-CM

## 2018-07-30 NOTE — Progress Notes (Signed)
Virtual Visit via Video   I connected with Lydia Torres on 07/30/18 at  1:20 PM EDT by a video enabled telemedicine application and verified that I am speaking with the correct person using two identifiers. Location patient: Home Location provider: [facility], Office Persons participating in the virtual visit: Lydia Torres, [place], DO Barnie Mort, CMA acting as scribe for Dr. Helane Rima.    I discussed the limitations of evaluation and management by telemedicine and the availability of in person appointments. The patient expressed understanding and agreed to proceed.  Subjective:   HPI: Patient has had knot in right leg that started Sunday. She does have some busing in the area. She circled the red area on Sunday and the spot has gotten some smaller. She has not pain with touching calf or moving leg as directed. She denies any injury. She does bike a lot but has not been in a few days. Patient states it feels like one spot. She does not feel a lot of little bumps. She has noticed after walking that it is more swollen. Patient instructed to do warm compresses. She is to start over the counter anti inflammatory.   Reviewed all precautions and expectations with prevention of Covid-19   ROS: See pertinent positives and negatives per HPI.  Patient Active Problem List   Diagnosis Date Noted  . IUD (intrauterine device) in place, Mirena inserted 06/12/16 07/03/2017  . History of cold sores, HSV-1 07/03/2017    Social History   Tobacco Use  . Smoking status: Never Smoker  . Smokeless tobacco: Never Used  Substance Use Topics  . Alcohol use: Yes    Alcohol/week: 3.0 - 4.0 standard drinks    Types: 3 - 4 Glasses of wine per week    Current Outpatient Medications:  .  diclofenac (VOLTAREN) 75 MG EC tablet, Take 75 mg by mouth 2 (two) times daily as needed. , Disp: , Rfl:  .  levonorgestrel (MIRENA) 20 MCG/24HR IUD, 1 each by Intrauterine route once. Reported on  05/12/2015, Disp: , Rfl:  .  valACYclovir (VALTREX) 1000 MG tablet, Take 2 tablets (2000 mg) by mouth every 12 hours for 24 hours prn., Disp: 30 tablet, Rfl: 1  Allergies  Allergen Reactions  . Oxycodone   . Codeine Rash  . Phenergan [Promethazine Hcl] Rash  . Sulfa Antibiotics Rash    Objective:   VITALS: Per patient if applicable, see vitals. GENERAL: Alert, appears well and in no acute distress. HEENT: Atraumatic, conjunctiva clear, no obvious abnormalities on inspection of external nose and ears. NECK: Normal movements of the head and neck. CARDIOPULMONARY: No increased WOB. Speaking in clear sentences. I:E ratio WNL.  MS: Moves all visible extremities without noticeable abnormality. PSYCH: Pleasant and cooperative, well-groomed. Speech normal rate and rhythm. Affect is appropriate. Insight and judgement are appropriate. Attention is focused, linear, and appropriate.  NEURO: CN grossly intact. Oriented as arrived to appointment on time with no prompting. Moves both UE equally.  SKIN: Right leg with coin sized area of redness with small nodule per patient. No streaking, skin changes, punctum, edema, pain with self-palpation. Bruise near site from biking.  Assessment and Plan:   Lydia Torres was seen today for rle pain.  Diagnoses and all orders for this visit:  Subcutaneous nodule of right lower leg Comments: Benign appearing. Likely mechanical rub, small hematoma. No sign of infection or DVT. Red flags reviewed.     . Reviewed expectations re: course of current medical  issues. . Discussed self-management of symptoms. . Outlined signs and symptoms indicating need for more acute intervention. . Patient verbalized understanding and all questions were answered. Marland Kitchen Health Maintenance issues including appropriate healthy diet, exercise, and smoking avoidance were discussed with patient. . See orders for this visit as documented in the electronic medical record.  Helane Rima,  DO 07/30/2018

## 2018-11-21 ENCOUNTER — Other Ambulatory Visit: Payer: Self-pay | Admitting: Obstetrics and Gynecology

## 2018-11-21 DIAGNOSIS — Z1231 Encounter for screening mammogram for malignant neoplasm of breast: Secondary | ICD-10-CM

## 2018-12-10 ENCOUNTER — Other Ambulatory Visit: Payer: Self-pay

## 2018-12-10 NOTE — Progress Notes (Signed)
43 y.o. G68P2002 Married Caucasian female here for annual exam.    No menses with Mirena.   Had an episode of vertigo last year.   She has eliminated gluten in her diet.  Hand joints are feeling better since stopping gluten.   Doing ok during the pandemic.  Working from home.   PCP: Briscoe Deutscher, DO    No LMP recorded. (Menstrual status: IUD).           Sexually active: Yes.    The current method of family planning is IUD--Mirena 06-12-16.    Exercising: Yes.    starting back recently--boot camp, high intensity, walking. Smoker:  no  Health Maintenance: Pap: 11-27-16 Neg:Neg HR HPV, 07-25-13 Neg History of abnormal Pap:  no MMG: 12-26-17 Diag.Bil./Neg/density C/BiRads1--appt. 01/10/19 Colonoscopy:  n/a BMD:   n/a  Result  n/a TDaP: 2015 Gardasil:   no HIV: 07-12-17 NR Hep C:no Screening Labs:  ----   reports that she has never smoked. She has never used smokeless tobacco. She reports current alcohol use of about 3.0 - 4.0 standard drinks of alcohol per week. She reports that she does not use drugs.  Past Medical History:  Diagnosis Date  . Abnormal uterine bleeding   . Chicken pox   . Dysmenorrhea   . HSV-1 infection   . Recurrent vaginal yeast infection   . Seasonal allergies     Past Surgical History:  Procedure Laterality Date  . BUNIONECTOMY Right 05/2015  . INTRAUTERINE DEVICE INSERTION  09/2011   2013 Mirena removed & re-inserted 06-12-16  . WISDOM TOOTH EXTRACTION      Current Outpatient Medications  Medication Sig Dispense Refill  . diclofenac (VOLTAREN) 75 MG EC tablet Take 75 mg by mouth 2 (two) times daily as needed.     Marland Kitchen levonorgestrel (MIRENA) 20 MCG/24HR IUD 1 each by Intrauterine route once. Reported on 05/12/2015    . valACYclovir (VALTREX) 1000 MG tablet Take 2 tablets (2000 mg) by mouth every 12 hours for 24 hours prn. 30 tablet 1   No current facility-administered medications for this visit.     Family History  Problem Relation Age of Onset   . Diabetes Mother   . Depression Mother   . Anxiety disorder Mother   . Liver disease Father   . Hypertension Father   . Prostate cancer Father 49  . Schizophrenia Brother   . Anxiety disorder Brother   . Depression Brother   . Breast cancer Paternal Aunt        2 paternal aunts with breast CA - BrCA negative  . Breast cancer Paternal Aunt     Review of Systems  All other systems reviewed and are negative.   Exam:   BP 100/64   Pulse 66   Temp (!) 97.2 F (36.2 C) (Temporal)   Resp 14   Ht 5' 5"  (1.651 m)   Wt 159 lb 9.6 oz (72.4 kg)   BMI 26.56 kg/m     General appearance: alert, cooperative and appears stated age Head: normocephalic, without obvious abnormality, atraumatic Neck: no adenopathy, supple, symmetrical, trachea midline and thyroid normal to inspection and palpation Lungs: clear to auscultation bilaterally Breasts: normal appearance, no masses or tenderness, No nipple retraction or dimpling, No nipple discharge or bleeding, No axillary adenopathy Heart: regular rate and rhythm Abdomen: soft, non-tender; no masses, no organomegaly Extremities: extremities normal, atraumatic, no cyanosis or edema Skin: skin color, texture, turgor normal. No rashes or lesions Lymph nodes: cervical, supraclavicular, and  axillary nodes normal. Neurologic: grossly normal  Pelvic: External genitalia:  no lesions              No abnormal inguinal nodes palpated.              Urethra:  normal appearing urethra with no masses, tenderness or lesions              Bartholins and Skenes: normal                 Vagina: normal appearing vagina with normal color and discharge, no lesions              Cervix: no lesions.. IUD strings noted.               Pap taken: No. Bimanual Exam:  Uterus:  normal size, contour, position, consistency, mobility, non-tender              Adnexa: no mass, fullness, tenderness              Rectal exam: Yes.  .  Confirms.              Anus:  normal  sphincter tone, no lesions  Chaperone was present for exam.  Assessment:   Well woman visit with normal exam. Mirena IUD.  HSV I. FH of breast cancer. Negative genetic testing.   Plan: Mammogram screening discussed.  She will do 3D. Self breast awareness reviewed. Pap and HR HPV as above. Guidelines for Calcium, Vitamin D, regular exercise program including cardiovascular and weight bearing exercise. Refill of Valtrex.  Routine labs.  Follow up annually and prn.   After visit summary provided.

## 2018-12-12 ENCOUNTER — Ambulatory Visit (INDEPENDENT_AMBULATORY_CARE_PROVIDER_SITE_OTHER): Payer: BC Managed Care – PPO | Admitting: Obstetrics and Gynecology

## 2018-12-12 ENCOUNTER — Other Ambulatory Visit: Payer: Self-pay

## 2018-12-12 ENCOUNTER — Encounter: Payer: Self-pay | Admitting: Obstetrics and Gynecology

## 2018-12-12 VITALS — BP 100/64 | HR 66 | Temp 97.2°F | Resp 14 | Ht 65.0 in | Wt 159.6 lb

## 2018-12-12 DIAGNOSIS — Z01419 Encounter for gynecological examination (general) (routine) without abnormal findings: Secondary | ICD-10-CM | POA: Diagnosis not present

## 2018-12-12 MED ORDER — VALACYCLOVIR HCL 1 G PO TABS: ORAL_TABLET | ORAL | 1 refills | Status: DC

## 2018-12-12 NOTE — Patient Instructions (Signed)

## 2018-12-13 LAB — COMPREHENSIVE METABOLIC PANEL
ALT: 19 IU/L (ref 0–32)
AST: 19 IU/L (ref 0–40)
Albumin/Globulin Ratio: 2.2 (ref 1.2–2.2)
Albumin: 4.3 g/dL (ref 3.8–4.8)
Alkaline Phosphatase: 42 IU/L (ref 39–117)
BUN/Creatinine Ratio: 16 (ref 9–23)
BUN: 14 mg/dL (ref 6–24)
Bilirubin Total: 0.5 mg/dL (ref 0.0–1.2)
CO2: 24 mmol/L (ref 20–29)
Calcium: 9 mg/dL (ref 8.7–10.2)
Chloride: 102 mmol/L (ref 96–106)
Creatinine, Ser: 0.85 mg/dL (ref 0.57–1.00)
GFR calc Af Amer: 98 mL/min/{1.73_m2} (ref >59)
GFR calc non Af Amer: 85 mL/min/{1.73_m2} (ref >59)
Globulin, Total: 2 g/dL (ref 1.5–4.5)
Glucose: 87 mg/dL (ref 65–99)
Potassium: 4 mmol/L (ref 3.5–5.2)
Sodium: 140 mmol/L (ref 134–144)
Total Protein: 6.3 g/dL (ref 6.0–8.5)

## 2018-12-13 LAB — LIPID PANEL
Chol/HDL Ratio: 1.9 ratio (ref 0.0–4.4)
Cholesterol, Total: 191 mg/dL (ref 100–199)
HDL: 98 mg/dL (ref >39)
LDL Calculated: 83 mg/dL (ref 0–99)
Triglycerides: 51 mg/dL (ref 0–149)
VLDL Cholesterol Cal: 10 mg/dL (ref 5–40)

## 2018-12-13 LAB — CBC
Hematocrit: 39.4 % (ref 34.0–46.6)
Hemoglobin: 13.1 g/dL (ref 11.1–15.9)
MCH: 30.5 pg (ref 26.6–33.0)
MCHC: 33.2 g/dL (ref 31.5–35.7)
MCV: 92 fL (ref 79–97)
Platelets: 327 10*3/uL (ref 150–450)
RBC: 4.29 x10E6/uL (ref 3.77–5.28)
RDW: 11.9 % (ref 11.7–15.4)
WBC: 5.7 10*3/uL (ref 3.4–10.8)

## 2019-01-10 ENCOUNTER — Other Ambulatory Visit: Payer: Self-pay

## 2019-01-10 ENCOUNTER — Ambulatory Visit
Admission: RE | Admit: 2019-01-10 | Discharge: 2019-01-10 | Disposition: A | Discharge status: Home / Self Care | Payer: BC Managed Care – PPO | Source: Ambulatory Visit | Attending: Obstetrics and Gynecology | Admitting: Obstetrics and Gynecology

## 2019-01-10 DIAGNOSIS — Z1231 Encounter for screening mammogram for malignant neoplasm of breast: Secondary | ICD-10-CM

## 2019-02-07 ENCOUNTER — Encounter: Payer: Self-pay | Admitting: Physician Assistant

## 2019-02-07 ENCOUNTER — Ambulatory Visit (INDEPENDENT_AMBULATORY_CARE_PROVIDER_SITE_OTHER): Payer: BC Managed Care – PPO | Admitting: Physician Assistant

## 2019-02-07 ENCOUNTER — Other Ambulatory Visit: Payer: Self-pay

## 2019-02-07 VITALS — BP 110/70 | HR 55 | Temp 97.2°F | Ht 65.0 in | Wt 159.5 lb

## 2019-02-07 DIAGNOSIS — Z23 Encounter for immunization: Secondary | ICD-10-CM | POA: Diagnosis not present

## 2019-02-07 DIAGNOSIS — R21 Rash and other nonspecific skin eruption: Secondary | ICD-10-CM | POA: Diagnosis not present

## 2019-02-07 DIAGNOSIS — Z809 Family history of malignant neoplasm, unspecified: Secondary | ICD-10-CM | POA: Diagnosis not present

## 2019-02-07 NOTE — Progress Notes (Signed)
Lydia Torres is a 43 y.o. female is here to discuss: Transfer of care.  I acted as a Education administrator for [employer], PA-C Anselmo Pickler, LPN  History of Present Illness:   Chief Complaint  Patient presents with  . Transfer of care    from Dr. Juleen China    HPI  Pt here to establish care today with provider. Transfer from Dr. Juleen China.   Family history of cancer  --reports strong family history of cancer on her dad side of the family.  Has great aunts with breast cancer and father with prostate cancer.  Also had a relative on dad side with colon cancer.  She has been seeing her OB/GYN for this, and does get regularly scheduled mammograms.  She has never seen a Dietitian for this.  Rash --she has area of red raised skin on her 2 fingers, specifically in the area of her lower nail fold on her fourth and fifth fingers of her left hand.  Denies itchiness, pain, open areas or oozing, fever, chills.  She does bite her nails.  There are no preventive care reminders to display for this patient.  Past Medical History:  Diagnosis Date  . Abnormal uterine bleeding   . Chicken pox   . Dysmenorrhea   . HSV-1 infection   . Recurrent vaginal yeast infection   . Seasonal allergies      Social History   Socioeconomic History  . Marital status: Married    Spouse name: Not on file  . Number of children: Not on file  . Years of education: Not on file  . Highest education level: Not on file  Occupational History    Employer: [employer]  Social Needs  . Financial resource strain: Not on file  . Food insecurity    Worry: Not on file    Inability: Not on file  . Transportation needs    Medical: Not on file    Non-medical: Not on file  Tobacco Use  . Smoking status: Never Smoker  . Smokeless tobacco: Never Used  Substance and Sexual Activity  . Alcohol use: Yes    Alcohol/week: 3.0 - 4.0 standard drinks    Types: 3 - 4 Glasses of wine per week  . Drug use: Never  . Sexual  activity: Yes    Partners: Male    Birth control/protection: I.U.D.  Lifestyle  . Physical activity    Days per week: Not on file    Minutes per session: Not on file  . Stress: Not on file  Relationships  . Social Herbalist on phone: Not on file    Gets together: Not on file    Attends religious service: Not on file    Active member of club or organization: Not on file    Attends meetings of clubs or organizations: Not on file    Relationship status: Not on file  . Intimate partner violence    Fear of current or ex partner: Not on file    Emotionally abused: Not on file    Physically abused: Not on file    Forced sexual activity: Not on file  Other Topics Concern  . Not on file  Social History Narrative  . Not on file    Past Surgical History:  Procedure Laterality Date  . BUNIONECTOMY Right 05/2015  . INTRAUTERINE DEVICE INSERTION  09/2011   2013 Mirena removed & re-inserted 06-12-16  . WISDOM TOOTH EXTRACTION  Family History  Problem Relation Age of Onset  . Diabetes Mother   . Depression Mother   . Anxiety disorder Mother   . Liver disease Father   . Hypertension Father   . Prostate cancer Father 38  . Schizophrenia Brother   . Anxiety disorder Brother   . Depression Brother   . Breast cancer Paternal Aunt        2 paternal aunts with breast CA - BrCA negative  . Breast cancer Paternal Aunt     PMHx, SurgHx, SocialHx, FamHx, Medications, and Allergies were reviewed in the Visit Navigator and updated as appropriate.   Patient Active Problem List   Diagnosis Date Noted  . IUD (intrauterine device) in place, Mirena inserted 06/12/16 07/03/2017  . History of cold sores, HSV-1 07/03/2017    Social History   Tobacco Use  . Smoking status: Never Smoker  . Smokeless tobacco: Never Used  Substance Use Topics  . Alcohol use: Yes    Alcohol/week: 3.0 - 4.0 standard drinks    Types: 3 - 4 Glasses of wine per week  . Drug use: Never    Current  Medications and Allergies:    Current Outpatient Medications:  .  diclofenac (VOLTAREN) 75 MG EC tablet, Take 75 mg by mouth 2 (two) times daily as needed. , Disp: , Rfl:  .  levonorgestrel (MIRENA) 20 MCG/24HR IUD, 1 each by Intrauterine route once. Reported on 05/12/2015, Disp: , Rfl:  .  valACYclovir (VALTREX) 1000 MG tablet, Take 2 tablets (2000 mg) by mouth every 12 hours for 24 hours prn., Disp: 30 tablet, Rfl: 1   Allergies  Allergen Reactions  . Oxycodone   . Codeine Rash  . Phenergan [Promethazine Hcl] Rash  . Sulfa Antibiotics Rash    Review of Systems   ROS Negative unless otherwise specified per HPI.  Vitals:   Vitals:   02/07/19 0920  BP: 110/70  Pulse: (!) 55  Temp: (!) 97.2 F (36.2 C)  TempSrc: Temporal  SpO2: 99%  Weight: 159 lb 8 oz (72.3 kg)  Height: 5' 5"  (1.651 m)     Body mass index is 26.54 kg/m.   Physical Exam:    Physical Exam Constitutional:      Appearance: She is well-developed.  HENT:     Head: Normocephalic and atraumatic.  Eyes:     Conjunctiva/sclera: Conjunctivae normal.  Neck:     Musculoskeletal: Normal range of motion and neck supple.  Pulmonary:     Effort: Pulmonary effort is normal.  Musculoskeletal: Normal range of motion.  Skin:    General: Skin is warm and dry.     Comments: Left hand, fourth and fifth fingers, both with raised area of erythema edema to proximal nail fold  Neurological:     Mental Status: She is alert and oriented to person, place, and time.  Psychiatric:        Behavior: Behavior normal.        Thought Content: Thought content normal.        Judgment: Judgment normal.      Assessment and Plan:    Luma was seen today for transfer of care.  Diagnoses and all orders for this visit:  Family history of cancer We briefly discussed this today.  I did offer a referral for her to see a genetics counselor, she states that she will consider this.  Rash Unclear etiology.  Possible fungus?  I  did give her a regimen to trial at home  for potential fungus, and recommended she stop biting her nails and follow-up if symptoms worsen or persist.  Need for immunization against influenza -     Flu Vaccine QUAD 36+ mos IM  . Reviewed expectations re: course of current medical issues. . Discussed self-management of symptoms. . Outlined signs and symptoms indicating need for more acute intervention. . Patient verbalized understanding and all questions were answered. . See orders for this visit as documented in the electronic medical record. . Patient received an After Visit Summary.  CMA or LPN served as scribe during this visit. History, Physical, and Plan performed by medical provider. The above documentation has been reviewed and is accurate and complete.   Inda Coke, PA-C , [facility] 02/07/2019  Follow-up: No follow-ups on file.

## 2019-02-07 NOTE — Patient Instructions (Signed)
It was great to see you!  Always here if you need anything!  Take care,  Inda Coke PA-C

## 2019-02-23 ENCOUNTER — Encounter: Payer: Self-pay | Admitting: Physician Assistant

## 2019-02-24 DIAGNOSIS — R0981 Nasal congestion: Secondary | ICD-10-CM | POA: Diagnosis not present

## 2019-02-24 DIAGNOSIS — R438 Other disturbances of smell and taste: Secondary | ICD-10-CM | POA: Diagnosis not present

## 2019-02-24 DIAGNOSIS — R0982 Postnasal drip: Secondary | ICD-10-CM | POA: Diagnosis not present

## 2019-02-24 DIAGNOSIS — Z20828 Contact with and (suspected) exposure to other viral communicable diseases: Secondary | ICD-10-CM | POA: Diagnosis not present

## 2019-02-24 NOTE — Telephone Encounter (Signed)
See note  Copied from [place] [id number]. Topic: General - Other >> Feb 24, 2019  2:48 PM Pauline Good wrote: Reason for CRM: pt has more questions and need call back from nurse

## 2019-02-24 NOTE — Telephone Encounter (Signed)
Spoke to Lydia Torres , did a rapid COVID test today at the [facility] in [place] and was positive. Lydia Torres said she is going to go to [facility] tomorrow to have another test done because she heard there is false positives. Lydia Torres said she wants to know what precautions there are and who needs to be tested. Told Lydia Torres people you have been around wilthout a mask should probably be tested like your children and husband but you can check with your Pediatrician to see what they say. Other wise can wait to see if symptomatic in 7 days and should quarantine. Lydia Torres verbalized understanding. Told Lydia Torres please let us know her results as soon as she gets them. Lydia Torres verbalized understanding.

## 2019-02-25 ENCOUNTER — Other Ambulatory Visit: Payer: Self-pay

## 2019-02-25 DIAGNOSIS — Z20828 Contact with and (suspected) exposure to other viral communicable diseases: Secondary | ICD-10-CM | POA: Diagnosis not present

## 2019-02-25 DIAGNOSIS — Z20822 Contact with and (suspected) exposure to covid-19: Secondary | ICD-10-CM

## 2019-02-27 ENCOUNTER — Encounter: Payer: Self-pay | Admitting: Physician Assistant

## 2019-02-27 LAB — NOVEL CORONAVIRUS, NAA: SARS-CoV-2, NAA: DETECTED — AB

## 2019-06-26 DIAGNOSIS — Z23 Encounter for immunization: Secondary | ICD-10-CM | POA: Diagnosis not present

## 2019-06-30 ENCOUNTER — Other Ambulatory Visit: Payer: Self-pay

## 2019-06-30 NOTE — Progress Notes (Signed)
Lydia Torres is a 44 y.o. female is here to discuss: Covid antibody testing  I acted as a Education administrator for [employer], PA-C Anselmo Pickler, LPN  History of Present Illness:   Chief Complaint  Patient presents with  . Discuss antibody testing    HPI    Pt here to discuss COVID antibody testing. Pt had COVID in Oct 2020. She would like COVID antibody testing because if she can prove she has antibodies she can resume some of her work, in-person.  Denies: fever, chills, nausea, vomiting, URI symptoms  There are no preventive care reminders to display for this patient.  Past Medical History:  Diagnosis Date  . Abnormal uterine bleeding   . Chicken pox   . Dysmenorrhea   . HSV-1 infection   . Recurrent vaginal yeast infection   . Seasonal allergies      Social History   Socioeconomic History  . Marital status: Married    Spouse name: Not on file  . Number of children: Not on file  . Years of education: Not on file  . Highest education level: Not on file  Occupational History    Employer: [employer]  Tobacco Use  . Smoking status: Never Smoker  . Smokeless tobacco: Never Used  Substance and Sexual Activity  . Alcohol use: Yes    Alcohol/week: 3.0 - 4.0 standard drinks    Types: 3 - 4 Glasses of wine per week  . Drug use: Never  . Sexual activity: Yes    Partners: Male    Birth control/protection: I.U.D.  Other Topics Concern  . Not on file  Social History Narrative  . Not on file   Social Determinants of Health   Financial Resource Strain:   . Difficulty of Paying Living Expenses: Not on file  Food Insecurity:   . Worried About Charity fundraiser in the Last Year: Not on file  . Ran Out of Food in the Last Year: Not on file  Transportation Needs:   . Lack of Transportation (Medical): Not on file  . Lack of Transportation (Non-Medical): Not on file  Physical Activity:   . Days of Exercise per Week: Not on file  . Minutes of Exercise per Session:  Not on file  Stress:   . Feeling of Stress : Not on file  Social Connections:   . Frequency of Communication with Friends and Family: Not on file  . Frequency of Social Gatherings with Friends and Family: Not on file  . Attends Religious Services: Not on file  . Active Member of Clubs or Organizations: Not on file  . Attends Archivist Meetings: Not on file  . Marital Status: Not on file  Intimate Partner Violence:   . Fear of Current or Ex-Partner: Not on file  . Emotionally Abused: Not on file  . Physically Abused: Not on file  . Sexually Abused: Not on file    Past Surgical History:  Procedure Laterality Date  . BUNIONECTOMY Right 05/2015  . INTRAUTERINE DEVICE INSERTION  09/2011   2013 Mirena removed & re-inserted 06-12-16  . WISDOM TOOTH EXTRACTION      Family History  Problem Relation Age of Onset  . Diabetes Mother   . Depression Mother   . Anxiety disorder Mother   . Liver disease Father   . Hypertension Father   . Prostate cancer Father 44  . Schizophrenia Brother   . Anxiety disorder Brother   . Depression Brother   .  Breast cancer Paternal Aunt        2 paternal aunts with breast CA - BrCA negative  . Breast cancer Paternal Aunt     PMHx, SurgHx, SocialHx, FamHx, Medications, and Allergies were reviewed in the Visit Navigator and updated as appropriate.   Patient Active Problem List   Diagnosis Date Noted  . IUD (intrauterine device) in place, Mirena inserted 06/12/16 07/03/2017  . History of cold sores, HSV-1 07/03/2017    Social History   Tobacco Use  . Smoking status: Never Smoker  . Smokeless tobacco: Never Used  Substance Use Topics  . Alcohol use: Yes    Alcohol/week: 3.0 - 4.0 standard drinks    Types: 3 - 4 Glasses of wine per week  . Drug use: Never    Current Medications and Allergies:    Current Outpatient Medications:  .  diclofenac (VOLTAREN) 75 MG EC tablet, Take 75 mg by mouth 2 (two) times daily as needed. , Disp: ,  Rfl:  .  levonorgestrel (MIRENA) 20 MCG/24HR IUD, 1 each by Intrauterine route once. Reported on 05/12/2015, Disp: , Rfl:  .  valACYclovir (VALTREX) 1000 MG tablet, Take 2 tablets (2000 mg) by mouth every 12 hours for 24 hours prn., Disp: 30 tablet, Rfl: 1   Allergies  Allergen Reactions  . Oxycodone   . Codeine Rash  . Phenergan [Promethazine Hcl] Rash  . Sulfa Antibiotics Rash    Review of Systems   Review of Systems  Constitutional: Negative for chills, fever, malaise/fatigue and weight loss.  Respiratory: Negative for shortness of breath.   Cardiovascular: Negative for chest pain, orthopnea, claudication and leg swelling.  Gastrointestinal: Negative for heartburn, nausea and vomiting.  Neurological: Negative for dizziness, tingling and headaches.    Vitals:   Vitals:   07/01/19 0808  BP: 118/76  Pulse: 62  Temp: (!) 97.3 F (36.3 C)  TempSrc: Temporal  SpO2: 99%  Weight: 159 lb 4 oz (72.2 kg)  Height: 5' 5"  (1.651 m)     Body mass index is 26.5 kg/m.   Physical Exam:    Physical Exam Constitutional:      Appearance: She is well-developed.  HENT:     Head: Normocephalic and atraumatic.  Eyes:     Conjunctiva/sclera: Conjunctivae normal.  Pulmonary:     Effort: Pulmonary effort is normal.  Musculoskeletal:        General: Normal range of motion.     Cervical back: Normal range of motion and neck supple.  Skin:    General: Skin is warm and dry.  Neurological:     Mental Status: She is alert and oriented to person, place, and time.  Psychiatric:        Behavior: Behavior normal.        Thought Content: Thought content normal.        Judgment: Judgment normal.      Assessment and Plan:    Oneal was seen today for discuss antibody testing.  Diagnoses and all orders for this visit:  Encounter for screening laboratory testing for COVID-19 virus in asymptomatic patient -     SAR CoV2 Serology (COVID 19)AB(IGG)IA   Will update labs per patient  request. Recommend that she proceed with vaccination as she is already planning to do so.  . Reviewed expectations re: course of current medical issues. . Discussed self-management of symptoms. . Outlined signs and symptoms indicating need for more acute intervention. . Patient verbalized understanding and all questions were answered. Marland Kitchen  See orders for this visit as documented in the electronic medical record. . Patient received an After Visit Summary.  CMA or LPN served as scribe during this visit. History, Physical, and Plan performed by medical provider. The above documentation has been reviewed and is accurate and complete.  Inda Coke, PA-C [place], [facility] 07/01/2019  Follow-up: No follow-ups on file.

## 2019-07-01 ENCOUNTER — Ambulatory Visit (INDEPENDENT_AMBULATORY_CARE_PROVIDER_SITE_OTHER): Payer: BC Managed Care – PPO | Admitting: Physician Assistant

## 2019-07-01 ENCOUNTER — Encounter: Payer: Self-pay | Admitting: Physician Assistant

## 2019-07-01 VITALS — BP 118/76 | HR 62 | Temp 97.3°F | Ht 65.0 in | Wt 159.2 lb

## 2019-07-01 DIAGNOSIS — Z20822 Contact with and (suspected) exposure to covid-19: Secondary | ICD-10-CM | POA: Diagnosis not present

## 2019-07-01 LAB — SARS-COV-2 IGG: SARS-COV-2 IgG: 3.32

## 2019-07-01 NOTE — Patient Instructions (Addendum)
It was great to see you!  We will be in touch with your lab results.  COVID-19 Antibody Test Please note that if you test positive for antibodies it could be a false positive because the antibodies are similar to the antibodies from other cold viruses.  If you have had COVID-19 and you have antibodies, we don't yet know if you this means you are immune from getting it again. If your test is negative then that means you have not yet had coronavirus.  Lastly, antibody tests may not be able to show if you have been exposed to COVID-19 because it can take at least two weeks after exposure to develop antibodies.

## 2019-07-18 ENCOUNTER — Encounter: Payer: Self-pay | Admitting: Certified Nurse Midwife

## 2019-08-05 DIAGNOSIS — Z23 Encounter for immunization: Secondary | ICD-10-CM | POA: Diagnosis not present

## 2019-08-11 ENCOUNTER — Encounter: Payer: Self-pay | Admitting: Physician Assistant

## 2019-08-12 ENCOUNTER — Other Ambulatory Visit: Payer: Self-pay | Admitting: Physician Assistant

## 2019-08-12 MED ORDER — CYCLOBENZAPRINE HCL 10 MG PO TABS: 10.0000 mg | ORAL_TABLET | Freq: Three times a day (TID) | ORAL | 0 refills | Status: DC | PRN

## 2019-10-21 ENCOUNTER — Other Ambulatory Visit: Payer: Self-pay | Admitting: Obstetrics and Gynecology

## 2019-10-21 DIAGNOSIS — Z1231 Encounter for screening mammogram for malignant neoplasm of breast: Secondary | ICD-10-CM

## 2019-12-16 ENCOUNTER — Ambulatory Visit: Payer: BC Managed Care – PPO | Admitting: Obstetrics and Gynecology

## 2020-01-14 ENCOUNTER — Other Ambulatory Visit: Payer: Self-pay

## 2020-01-14 ENCOUNTER — Ambulatory Visit
Admission: RE | Admit: 2020-01-14 | Discharge: 2020-01-14 | Disposition: A | Discharge status: Home / Self Care | Payer: BC Managed Care – PPO | Source: Ambulatory Visit | Attending: Obstetrics and Gynecology | Admitting: Obstetrics and Gynecology

## 2020-01-14 DIAGNOSIS — Z1231 Encounter for screening mammogram for malignant neoplasm of breast: Secondary | ICD-10-CM | POA: Diagnosis not present

## 2020-02-11 ENCOUNTER — Other Ambulatory Visit: Payer: Self-pay

## 2020-02-11 ENCOUNTER — Ambulatory Visit (INDEPENDENT_AMBULATORY_CARE_PROVIDER_SITE_OTHER): Payer: BC Managed Care – PPO | Admitting: Family Medicine

## 2020-02-11 ENCOUNTER — Encounter: Payer: Self-pay | Admitting: Family Medicine

## 2020-02-11 VITALS — BP 120/76 | HR 74 | Temp 98.3°F | Wt 151.8 lb

## 2020-02-11 DIAGNOSIS — J029 Acute pharyngitis, unspecified: Secondary | ICD-10-CM

## 2020-02-11 NOTE — Progress Notes (Signed)
Same day acute visit; PCP not available. New pt to me. Chart reviewed.   Subjective  CC:  Chief Complaint  Patient presents with  . Lydia Torres    believes to have gotten from her daughter, denies any recent antibiotic use, recent had swollen lymph node right side of neck, noticed inside of mouth was white Sunday night - did start probiotic with no relief    HPI: Lydia Torres is a 44 y.o. female who presents to the office today to address the problems listed above in the chief complaint.  Healthy 44 year old female who noticed a sore throat and swollen mildly tender right lymph node in her neck about a week ago.  Since, her sore throat is improved although still mildly red.  Looking at the back of her throat she noticed bumps on the back of her tongue and some white in the back of her throat.  She is worried she has thrush.  Her daughter has been struggling with several issues and has yeast problems and possible thrush currently.  She denies fevers, chills, loss of taste or smell, cough or shortness of breath.  She has been vaccinated against Covid.  She has no systemic symptoms.  No GI symptoms.  She has no history of thrush.  She is not immunosuppressed.  She does suffer from recurrent yeast vaginitis.   Assessment  1. Pharyngitis, unspecified etiology      Plan   Symptom complex most consistent with pharyngitis/URI versus allergies: No evidence of thrush on exam.  Reassured.  Start Mucinex, Advil and an antihistamine.  Follow-up if not improved.  Follow up:   Visit date not found  No orders of the defined types were placed in this encounter.  No orders of the defined types were placed in this encounter.     I reviewed the patients updated PMH, FH, and SocHx.    Patient Active Problem List   Diagnosis Date Noted  . IUD (intrauterine device) in place, Mirena inserted 06/12/16 07/03/2017  . History of cold sores, HSV-1 07/03/2017   Current Meds  Medication Sig  .  cyclobenzaprine (FLEXERIL) 10 MG tablet Take 1 tablet (10 mg total) by mouth 3 (three) times daily as needed for muscle spasms.  . diclofenac (VOLTAREN) 75 MG EC tablet Take 75 mg by mouth 2 (two) times daily as needed.   Marland Kitchen levonorgestrel (MIRENA) 20 MCG/24HR IUD 1 each by Intrauterine route once. Reported on 05/12/2015  . valACYclovir (VALTREX) 1000 MG tablet Take 2 tablets (2000 mg) by mouth every 12 hours for 24 hours prn.    Allergies: Patient is allergic to oxycodone, codeine, phenergan [promethazine hcl], and sulfa antibiotics. Family History: Patient family history includes Anxiety disorder in her brother and mother; Breast cancer in her paternal aunt and paternal aunt; Depression in her brother and mother; Diabetes in her mother; Hypertension in her father; Liver disease in her father; Prostate cancer (age of onset: 56) in her father; Schizophrenia in her brother. Social History:  Patient  reports that she has never smoked. She has never used smokeless tobacco. She reports current alcohol use of about 3.0 - 4.0 standard drinks of alcohol per week. She reports that she does not use drugs.  Review of Systems: Constitutional: Negative for fever malaise or anorexia Cardiovascular: negative for chest pain Respiratory: negative for SOB or persistent cough Gastrointestinal: negative for abdominal pain  Objective  Vitals: BP 120/76   Pulse 74   Temp 98.3 F (36.8 C) (Temporal)  Wt 151 lb 12.8 oz (68.9 kg)   SpO2 99%   BMI 25.26 kg/m  General: no acute distress , A&Ox3 HEENT: PEERL, conjunctiva normal, neck is supple, posterior pharynx with mucoid PND, no sinus tenderness, right pharynx is red without exudate.  Right cervical lymphadenopathy is present Cardiovascular:  RRR without murmur or gallop.  Respiratory:  Good breath sounds bilaterally, CTAB with normal respiratory effort Skin:  Warm, no rashes     Commons side effects, risks, benefits, and alternatives for medications and  treatment plan prescribed today were discussed, and the patient expressed understanding of the given instructions. Patient is instructed to call or message via MyChart if he/she has any questions or concerns regarding our treatment plan. No barriers to understanding were identified. We discussed Red Flag symptoms and signs in detail. Patient expressed understanding regarding what to do in case of urgent or emergency type symptoms.   Medication list was reconciled, printed and provided to the patient in AVS. Patient instructions and summary information was reviewed with the patient as documented in the AVS. This note was prepared with assistance of Dragon voice recognition software. Occasional wrong-word or sound-a-like substitutions may have occurred due to the inherent limitations of voice recognition software  This visit occurred during the SARS-CoV-2 public health emergency.  Safety protocols were in place, including screening questions prior to the visit, additional usage of staff PPE, and extensive cleaning of exam room while observing appropriate contact time as indicated for disinfecting solutions.

## 2020-02-16 ENCOUNTER — Ambulatory Visit: Payer: BC Managed Care – PPO | Admitting: Physician Assistant

## 2020-02-19 ENCOUNTER — Telehealth: Payer: Self-pay

## 2020-02-19 NOTE — Telephone Encounter (Signed)
Left message on voicemail to call and reschedule cancelled appointment. °

## 2020-02-23 NOTE — Progress Notes (Signed)
44 y.o. G22P2002 Married White or Caucasian Not Hispanic or Latino female here for annual exam.  Needs a refill of her valtrex. Patient says that she has been under a lot of stress and feels very over whelmed.  She is also having hot flashes at night.   Daughter is 30, has anxiety, ADHD, multiple food allergies and is struggling in school.  Colleen is very stressed, not sleeping. She has started meditating, running, trying to take care of herself. She has started taking supplements, in the last few days she is feeling a little better.  The anxiety has just started in the last 4-6 weeks, no h/o anxiety, no depression.  She wakes up at 2-3 am and is worrying, having trouble going back to sleep.  She has been having hot flashes intermittently at night over the last 4-6 weeks. No dyspareunia.   She has a mirena IUD, inserted in 2/18. No cycles. No premenstrual symptoms.   She gets oral hsv 1 x a year. Needs a valtrex refill.    No LMP recorded. (Menstrual status: IUD).          Sexually active: Yes.    The current method of family planning is IUD.    Exercising: Yes.    walking Smoker:  no  Health Maintenance: Pap:  11-27-16 Neg:Neg HR HPV, 07-25-13 Neg History of abnormal Pap:  no MMG:  01/16/20 density C Bi-rads 1 neg  BMD:   NA  Colonoscopy: NA TDaP:  2015  Gardasil: no    reports that she has never smoked. She has never used smokeless tobacco. She reports current alcohol use of about 3.0 - 4.0 standard drinks of alcohol per week. She reports that she does not use drugs. She is a drug rep. 53 year daughter and 78 son.   Past Medical History:  Diagnosis Date  . Abnormal uterine bleeding   . Chicken pox   . Dysmenorrhea   . HSV-1 infection   . Recurrent vaginal yeast infection   . Seasonal allergies     Past Surgical History:  Procedure Laterality Date  . BUNIONECTOMY Right 05/2015  . INTRAUTERINE DEVICE INSERTION  09/2011   2013 Mirena removed & re-inserted 06-12-16  . WISDOM TOOTH  EXTRACTION      Current Outpatient Medications  Medication Sig Dispense Refill  . b complex vitamins capsule Take 1 capsule by mouth daily.    . Cholecalciferol (VITAMIN D3) 250 MCG (10000 UT) TABS Take by mouth.    . cyclobenzaprine (FLEXERIL) 10 MG tablet Take 1 tablet (10 mg total) by mouth 3 (three) times daily as needed for muscle spasms. 30 tablet 0  . diclofenac (VOLTAREN) 75 MG EC tablet Take 75 mg by mouth 2 (two) times daily as needed.     Marland Kitchen levonorgestrel (MIRENA) 20 MCG/24HR IUD 1 each by Intrauterine route once. Reported on 05/12/2015    . magnesium gluconate (MAGONATE) 500 MG tablet Take 500 mg by mouth 2 (two) times daily.    . valACYclovir (VALTREX) 1000 MG tablet Take 2 tablets (2000 mg) by mouth every 12 hours for 24 hours prn. 30 tablet 1   No current facility-administered medications for this visit.    Family History  Problem Relation Age of Onset  . Diabetes Mother   . Depression Mother   . Anxiety disorder Mother   . Liver disease Father   . Hypertension Father   . Prostate cancer Father 35  . Schizophrenia Brother   . Anxiety disorder Brother   .  Depression Brother   . Breast cancer Paternal Aunt        2 paternal aunts with breast CA - BrCA negative  . Breast cancer Paternal Aunt   Both her Aunts had negative genetic testing. One in her late 63's other in her 96's.  Dad with prostate cancer.  Dad has 4 sisters, 2 with breast cancer. He has 3 brother, one has prostate cancer.   Review of Systems  Psychiatric/Behavioral: Positive for sleep disturbance. The patient is nervous/anxious.   All other systems reviewed and are negative.   Exam:   BP 110/62   Pulse 89   Ht 5' 4.5" (1.638 m)   Wt 153 lb (69.4 kg)   SpO2 100%   BMI 25.86 kg/m   Weight change: _0 @ Height:   Height: 5' 4.5" (163.8 cm)  Ht Readings from Last 3 Encounters:  02/24/20 5' 4.5" (1.638 m)  07/01/19 _1  (1.651 m)  02/07/19 _2  (1.651 m)    General appearance:  alert, cooperative and appears stated age Head: Normocephalic, without obvious abnormality, atraumatic Neck: no adenopathy, supple, symmetrical, trachea midline and thyroid normal to inspection and palpation Lungs: clear to auscultation bilaterally Cardiovascular: regular rate and rhythm Breasts: normal appearance, no masses or tenderness Abdomen: soft, non-tender; non distended,  no masses,  no organomegaly Extremities: extremities normal, atraumatic, no cyanosis or edema Skin: Skin color, texture, turgor normal. No rashes or lesions Lymph nodes: Cervical, supraclavicular, and axillary nodes normal. No abnormal inguinal nodes palpated Neurologic: Grossly normal   Pelvic: External genitalia:  no lesions              Urethra:  normal appearing urethra with no masses, tenderness or lesions              Bartholins and Skenes: normal                 Vagina: normal appearing vagina with normal color and discharge, no lesions              Cervix: no lesions and IUD string 1.5 cm               Bimanual Exam:  Uterus:  normal size, contour, position, consistency, mobility, non-tender and retroverted              Adnexa: no mass, fullness, tenderness               Rectovaginal: Confirms               Anus:  normal sphincter tone, no lesions  Gae Dry chaperoned for the exam.  A:  Well Woman with normal exam  Hot flashes, intermittently at night  Anxiety  Limited diet, def in vit d and B12  P:   No pap this year  CBC, CMP, FSH, vit d, vit B12  Normal lipids last year  Mammogram UTD  Discussed breast self exam  Discussed calcium and vit D intake  Discussed perimenopause. Discussed behavior changes and herbal products for menopause  Discussed anxiety, information given  Start Celexa, f/u in one month  Name of counselor given   In addition to the annual exam, over 10 minutes was spent in evaluation and counseling of anxiety.

## 2020-02-24 ENCOUNTER — Encounter: Payer: Self-pay | Admitting: Obstetrics and Gynecology

## 2020-02-24 ENCOUNTER — Ambulatory Visit (INDEPENDENT_AMBULATORY_CARE_PROVIDER_SITE_OTHER): Payer: BC Managed Care – PPO | Admitting: Obstetrics and Gynecology

## 2020-02-24 ENCOUNTER — Other Ambulatory Visit: Payer: Self-pay

## 2020-02-24 VITALS — BP 110/62 | HR 89 | Ht 64.5 in | Wt 153.0 lb

## 2020-02-24 DIAGNOSIS — R232 Flushing: Secondary | ICD-10-CM | POA: Diagnosis not present

## 2020-02-24 DIAGNOSIS — F419 Anxiety disorder, unspecified: Secondary | ICD-10-CM | POA: Diagnosis not present

## 2020-02-24 DIAGNOSIS — Z0000 Encounter for general adult medical examination without abnormal findings: Secondary | ICD-10-CM

## 2020-02-24 DIAGNOSIS — E559 Vitamin D deficiency, unspecified: Secondary | ICD-10-CM | POA: Diagnosis not present

## 2020-02-24 DIAGNOSIS — E539 Vitamin B deficiency, unspecified: Secondary | ICD-10-CM

## 2020-02-24 DIAGNOSIS — Z01419 Encounter for gynecological examination (general) (routine) without abnormal findings: Secondary | ICD-10-CM | POA: Diagnosis not present

## 2020-02-24 MED ORDER — VALACYCLOVIR HCL 1 G PO TABS: ORAL_TABLET | ORAL | 1 refills | Status: DC

## 2020-02-24 MED ORDER — CITALOPRAM HYDROBROMIDE 20 MG PO TABS: 20.0000 mg | ORAL_TABLET | Freq: Every day | ORAL | 1 refills | Status: DC

## 2020-02-24 NOTE — Patient Instructions (Addendum)
You can try Estroven pm for hot flashes at night and sleep issues.  Counselor: Roosevelt Locks [phone]   Perimenopause  Perimenopause is the normal time of life before and after menstrual periods stop completely (menopause). Perimenopause can begin 2-8 years before menopause, and it usually lasts for 1 year after menopause. During perimenopause, the ovaries may or may not produce an egg. What are the causes? This condition is caused by a natural change in hormone levels that happens as you get older. What increases the risk? This condition is more likely to start at an earlier age if you have certain medical conditions or treatments, including:  A tumor of the pituitary gland in the brain.  A disease that affects the ovaries and hormone production.  Radiation treatment for cancer.  Certain cancer treatments, such as chemotherapy or hormone (anti-estrogen) therapy.  Heavy smoking and excessive alcohol use.  Family history of early menopause. What are the signs or symptoms? Perimenopausal changes affect each woman differently. Symptoms of this condition may include:  Hot flashes.  Night sweats.  Irregular menstrual periods.  Decreased sex drive.  Vaginal dryness.  Headaches.  Mood swings.  Depression.  Memory problems or trouble concentrating.  Irritability.  Tiredness.  Weight gain.  Anxiety.  Trouble getting pregnant. How is this diagnosed? This condition is diagnosed based on your medical history, a physical exam, your age, your menstrual history, and your symptoms. Hormone tests may also be done. How is this treated? In some cases, no treatment is needed. You and your health care provider should make a decision together about whether treatment is necessary. Treatment will be based on your individual condition and preferences. Various treatments are available, such as:  Menopausal hormone therapy (MHT).  Medicines to treat specific  symptoms.  Acupuncture.  Vitamin or herbal supplements. Before starting treatment, make sure to let your health care provider know if you have a personal or family history of:  Heart disease.  Breast cancer.  Blood clots.  Diabetes.  Osteoporosis. Follow these instructions at home: Lifestyle  Do not use any products that contain nicotine or tobacco, such as cigarettes and e-cigarettes. If you need help quitting, ask your health care provider.  Eat a balanced diet that includes fresh fruits and vegetables, whole grains, soybeans, eggs, lean meat, and low-fat dairy.  Get at least 30 minutes of physical activity on 5 or more days each week.  Avoid alcoholic and caffeinated beverages, as well as spicy foods. This may help prevent hot flashes.  Get 7-8 hours of sleep each night.  Dress in layers that can be removed to help you manage hot flashes.  Find ways to manage stress, such as deep breathing, meditation, or journaling. General instructions  Keep track of your menstrual periods, including: ? When they occur. ? How heavy they are and how long they last. ? How much time passes between periods.  Keep track of your symptoms, noting when they start, how often you have them, and how long they last.  Take over-the-counter and prescription medicines only as told by your health care provider.  Take vitamin supplements only as told by your health care provider. These may include calcium, vitamin E, and vitamin D.  Use vaginal lubricants or moisturizers to help with vaginal dryness and improve comfort during sex.  Talk with your health care provider before starting any herbal supplements.  Keep all follow-up visits as told by your health care provider. This is important. This includes any group therapy or  counseling. Contact a health care provider if:  You have heavy vaginal bleeding or pass blood clots.  Your period lasts more than 2 days longer than normal.  Your  periods are recurring sooner than 21 days.  You bleed after having sex. Get help right away if:  You have chest pain, trouble breathing, or trouble talking.  You have severe depression.  You have pain when you urinate.  You have severe headaches.  You have vision problems. Summary  Perimenopause is the time when a woman's body begins to move into menopause. This may happen naturally or as a result of other health problems or medical treatments.  Perimenopause can begin 2-8 years before menopause, and it usually lasts for 1 year after menopause.  Perimenopausal symptoms can be managed through medicines, lifestyle changes, and complementary therapies such as acupuncture. This information is not intended to replace advice given to you by your health care provider. Make sure you discuss any questions you have with your health care provider. Document Revised: 03/30/2017 Document Reviewed: 05/23/2016 Elsevier Patient Education  2020 Elsevier Inc.   Managing Anxiety, Adult After being diagnosed with an anxiety disorder, you may be relieved to know why you have felt or behaved a certain way. You may also feel overwhelmed about the treatment ahead and what it will mean for your life. With care and support, you can manage this condition and recover from it. How to manage lifestyle changes Managing stress and anxiety  Stress is your body's reaction to life changes and events, both good and bad. Most stress will last just a few hours, but stress can be ongoing and can lead to more than just stress. Although stress can play a major role in anxiety, it is not the same as anxiety. Stress is usually caused by something external, such as a deadline, test, or competition. Stress normally passes after the triggering event has ended.  Anxiety is caused by something internal, such as imagining a terrible outcome or worrying that something will go wrong that will devastate you. Anxiety often does not go  away even after the triggering event is over, and it can become long-term (chronic) worry. It is important to understand the differences between stress and anxiety and to manage your stress effectively so that it does not lead to an anxious response. Talk with your health care provider or a counselor to learn more about reducing anxiety and stress. He or she may suggest tension reduction techniques, such as:  Music therapy. This can include creating or listening to music that you enjoy and that inspires you.  Mindfulness-based meditation. This involves being aware of your normal breaths while not trying to control your breathing. It can be done while sitting or walking.  Centering prayer. This involves focusing on a word, phrase, or sacred image that means something to you and brings you peace.  Deep breathing. To do this, expand your stomach and inhale slowly through your nose. Hold your breath for 3-5 seconds. Then exhale slowly, letting your stomach muscles relax.  Self-talk. This involves identifying thought patterns that lead to anxiety reactions and changing those patterns.  Muscle relaxation. This involves tensing muscles and then relaxing them. Choose a tension reduction technique that suits your lifestyle and personality. These techniques take time and practice. Set aside 5-15 minutes a day to do them. Therapists can offer counseling and training in these techniques. The training to help with anxiety may be covered by some insurance plans. Other things you can  do to manage stress and anxiety include:  Keeping a stress/anxiety diary. This can help you learn what triggers your reaction and then learn ways to manage your response.  Thinking about how you react to certain situations. You may not be able to control everything, but you can control your response.  Making time for activities that help you relax and not feeling guilty about spending your time in this way.  Visual imagery and  yoga can help you stay calm and relax.  Medicines Medicines can help ease symptoms. Medicines for anxiety include:  Anti-anxiety drugs.  Antidepressants. Medicines are often used as a primary treatment for anxiety disorder. Medicines will be prescribed by a health care provider. When used together, medicines, psychotherapy, and tension reduction techniques may be the most effective treatment. Relationships Relationships can play a big part in helping you recover. Try to spend more time connecting with trusted friends and family members. Consider going to couples counseling, taking family education classes, or going to family therapy. Therapy can help you and others better understand your condition. How to recognize changes in your anxiety Everyone responds differently to treatment for anxiety. Recovery from anxiety happens when symptoms decrease and stop interfering with your daily activities at home or work. This may mean that you will start to:  Have better concentration and focus. Worry will interfere less in your daily thinking.  Sleep better.  Be less irritable.  Have more energy.  Have improved memory. It is important to recognize when your condition is getting worse. Contact your health care provider if your symptoms interfere with home or work and you feel like your condition is not improving. Follow these instructions at home: Activity  Exercise. Most adults should do the following: ? Exercise for at least 150 minutes each week. The exercise should increase your heart rate and make you sweat (moderate-intensity exercise). ? Strengthening exercises at least twice a week.  Get the right amount and quality of sleep. Most adults need 7-9 hours of sleep each night. Lifestyle   Eat a healthy diet that includes plenty of vegetables, fruits, whole grains, low-fat dairy products, and lean protein. Do not eat a lot of foods that are high in solid fats, added sugars, or salt.  Make  choices that simplify your life.  Do not use any products that contain nicotine or tobacco, such as cigarettes, e-cigarettes, and chewing tobacco. If you need help quitting, ask your health care provider.  Avoid caffeine, alcohol, and certain over-the-counter cold medicines. These may make you feel worse. Ask your pharmacist which medicines to avoid. General instructions  Take over-the-counter and prescription medicines only as told by your health care provider.  Keep all follow-up visits as told by your health care provider. This is important. Where to find support You can get help and support from these sources:  Self-help groups.  Online and [employer].  A trusted spiritual leader.  Couples counseling.  Family education classes.  Family therapy. Where to find more information You may find that joining a support group helps you deal with your anxiety. The following sources can help you locate counselors or support groups near you:  Mental Health America: www.mentalhealthamerica.net  Anxiety and Depression Association of [place] (ADAA): ProgramCam.de  [employer] Mental Illness (NAMI): www.nami.org Contact a health care provider if you:  Have a hard time staying focused or finishing daily tasks.  Spend many hours a day feeling worried about everyday life.  Become exhausted by worry.  Start  to have headaches, feel tense, or have nausea.  Urinate more than normal.  Have diarrhea. Get help right away if you have:  A racing heart and shortness of breath.  Thoughts of hurting yourself or others. If you ever feel like you may hurt yourself or others, or have thoughts about taking your own life, get help right away. You can go to your nearest emergency department or call:  Your local emergency services (911 in the [place]  A suicide crisis helpline, such as the National Suicide Prevention Lifeline at 2182205469. This is open 24 hours a  day. Summary  Taking steps to learn and use tension reduction techniques can help calm you and help prevent triggering an anxiety reaction.  When used together, medicines, psychotherapy, and tension reduction techniques may be the most effective treatment.  Family, friends, and partners can play a big part in helping you recover from an anxiety disorder. This information is not intended to replace advice given to you by your health care provider. Make sure you discuss any questions you have with your health care provider. Document Revised: 09/17/2018 Document Reviewed: 09/17/2018 Elsevier Patient Education  2020 Elsevier Inc.  EXERCISE AND DIET:  We recommended that you start or continue a regular exercise program for good health. Regular exercise means any activity that makes your heart beat faster and makes you sweat.  We recommend exercising at least 30 minutes per day at least 3 days a week, preferably 4 or 5.  We also recommend a diet low in fat and sugar.  Inactivity, poor dietary choices and obesity can cause diabetes, heart attack, stroke, and kidney damage, among others.    ALCOHOL AND SMOKING:  Women should limit their alcohol intake to no more than 7 drinks/beers/glasses of wine (combined, not each!) per week. Moderation of alcohol intake to this level decreases your risk of breast cancer and liver damage. And of course, no recreational drugs are part of a healthy lifestyle.  And absolutely no smoking or even second hand smoke. Most people know smoking can cause heart and lung diseases, but did you know it also contributes to weakening of your bones? Aging of your skin?  Yellowing of your teeth and nails?  CALCIUM AND VITAMIN D:  Adequate intake of calcium and Vitamin D are recommended.  The recommendations for exact amounts of these supplements seem to change often, but generally speaking 1,000 mg of calcium (between diet and supplement) and 800 units of Vitamin D per day seems prudent.  Certain women may benefit from higher intake of Vitamin D.  If you are among these women, your doctor will have told you during your visit.    PAP SMEARS:  Pap smears, to check for cervical cancer or precancers,  have traditionally been done yearly, although recent scientific advances have shown that most women can have pap smears less often.  However, every woman still should have a physical exam from her gynecologist every year. It will include a breast check, inspection of the vulva and vagina to check for abnormal growths or skin changes, a visual exam of the cervix, and then an exam to evaluate the size and shape of the uterus and ovaries.  And after 43 years of age, a rectal exam is indicated to check for rectal cancers. We will also provide age appropriate advice regarding health maintenance, like when you should have certain vaccines, screening for sexually transmitted diseases, bone density testing, colonoscopy, mammograms, etc.   MAMMOGRAMS:  All women over  30 years old should have a yearly mammogram. Many facilities now offer a "3D" mammogram, which may cost around $50 extra out of pocket. If possible,  we recommend you accept the option to have the 3D mammogram performed.  It both reduces the number of women who will be called back for extra views which then turn out to be normal, and it is better than the routine mammogram at detecting truly abnormal areas.    COLON CANCER SCREENING: Now recommend starting at age 62. At this time colonoscopy is not covered for routine screening until 50. There are take home tests that can be done between 45-49.   COLONOSCOPY:  Colonoscopy to screen for colon cancer is recommended for all women at age 54.  We know, you hate the idea of the prep.  We agree, BUT, having colon cancer and not knowing it is worse!!  Colon cancer so often starts as a polyp that can be seen and removed at colonscopy, which can quite literally save your life!  And if your first  colonoscopy is normal and you have no family history of colon cancer, most women don't have to have it again for 10 years.  Once every ten years, you can do something that may end up saving your life, right?  We will be happy to help you get it scheduled when you are ready.  Be sure to check your insurance coverage so you understand how much it will cost.  It may be covered as a preventative service at no cost, but you should check your particular policy.      Breast Self-Awareness Breast self-awareness means being familiar with how your breasts look and feel. It involves checking your breasts regularly and reporting any changes to your health care provider. Practicing breast self-awareness is important. A change in your breasts can be a sign of a serious medical problem. Being familiar with how your breasts look and feel allows you to find any problems early, when treatment is more likely to be successful. All women should practice breast self-awareness, including women who have had breast implants. How to do a breast self-exam One way to learn what is normal for your breasts and whether your breasts are changing is to do a breast self-exam. To do a breast self-exam: Look for Changes  1. Remove all the clothing above your waist. 2. Stand in front of a mirror in a room with good lighting. 3. Put your hands on your hips. 4. Push your hands firmly downward. 5. Compare your breasts in the mirror. Look for differences between them (asymmetry), such as: ? Differences in shape. ? Differences in size. ? Puckers, dips, and bumps in one breast and not the other. 6. Look at each breast for changes in your skin, such as: ? Redness. ? Scaly areas. 7. Look for changes in your nipples, such as: ? Discharge. ? Bleeding. ? Dimpling. ? Redness. ? A change in position. Feel for Changes Carefully feel your breasts for lumps and changes. It is best to do this while lying on your back on the floor and again  while sitting or standing in the shower or tub with soapy water on your skin. Feel each breast in the following way:  Place the arm on the side of the breast you are examining above your head.  Feel your breast with the other hand.  Start in the nipple area and make  inch (2 cm) overlapping circles to feel your breast. Use the  pads of your three middle fingers to do this. Apply light pressure, then medium pressure, then firm pressure. The light pressure will allow you to feel the tissue closest to the skin. The medium pressure will allow you to feel the tissue that is a little deeper. The firm pressure will allow you to feel the tissue close to the ribs.  Continue the overlapping circles, moving downward over the breast until you feel your ribs below your breast.  Move one finger-width toward the center of the body. Continue to use the  inch (2 cm) overlapping circles to feel your breast as you move slowly up toward your collarbone.  Continue the up and down exam using all three pressures until you reach your armpit.  Write Down What You Find  Write down what is normal for each breast and any changes that you find. Keep a written record with breast changes or normal findings for each breast. By writing this information down, you do not need to depend only on memory for size, tenderness, or location. Write down where you are in your menstrual cycle, if you are still menstruating. If you are having trouble noticing differences in your breasts, do not get discouraged. With time you will become more familiar with the variations in your breasts and more comfortable with the exam. How often should I examine my breasts? Examine your breasts every month. If you are breastfeeding, the best time to examine your breasts is after a feeding or after using a breast pump. If you menstruate, the best time to examine your breasts is 5-7 days after your period is over. During your period, your breasts are  lumpier, and it may be more difficult to notice changes. When should I see my health care provider? See your health care provider if you notice:  A change in shape or size of your breasts or nipples.  A change in the skin of your breast or nipples, such as a reddened or scaly area.  Unusual discharge from your nipples.  A lump or thick area that was not there before.  Pain in your breasts.  Anything that concerns you.

## 2020-02-25 LAB — CBC
Hematocrit: 42.5 % (ref 34.0–46.6)
Hemoglobin: 14.4 g/dL (ref 11.1–15.9)
MCH: 31.8 pg (ref 26.6–33.0)
MCHC: 33.9 g/dL (ref 31.5–35.7)
MCV: 94 fL (ref 79–97)
Platelets: 314 10*3/uL (ref 150–450)
RBC: 4.53 x10E6/uL (ref 3.77–5.28)
RDW: 12 % (ref 11.7–15.4)
WBC: 8.1 10*3/uL (ref 3.4–10.8)

## 2020-02-25 LAB — COMPREHENSIVE METABOLIC PANEL
ALT: 18 IU/L (ref 0–32)
AST: 15 IU/L (ref 0–40)
Albumin/Globulin Ratio: 2.5 — ABNORMAL HIGH (ref 1.2–2.2)
Albumin: 4.7 g/dL (ref 3.8–4.8)
Alkaline Phosphatase: 52 IU/L (ref 44–121)
BUN/Creatinine Ratio: 14 (ref 9–23)
BUN: 11 mg/dL (ref 6–24)
Bilirubin Total: 0.3 mg/dL (ref 0.0–1.2)
CO2: 25 mmol/L (ref 20–29)
Calcium: 9.2 mg/dL (ref 8.7–10.2)
Chloride: 101 mmol/L (ref 96–106)
Creatinine, Ser: 0.76 mg/dL (ref 0.57–1.00)
GFR calc Af Amer: 111 mL/min/{1.73_m2} (ref >59)
GFR calc non Af Amer: 96 mL/min/{1.73_m2} (ref >59)
Globulin, Total: 1.9 g/dL (ref 1.5–4.5)
Glucose: 90 mg/dL (ref 65–99)
Potassium: 4.1 mmol/L (ref 3.5–5.2)
Sodium: 139 mmol/L (ref 134–144)
Total Protein: 6.6 g/dL (ref 6.0–8.5)

## 2020-02-25 LAB — VITAMIN B12: Vitamin B-12: 547 pg/mL (ref 232–1245)

## 2020-02-25 LAB — FOLLICLE STIMULATING HORMONE: FSH: 3.6 m[IU]/mL

## 2020-02-25 LAB — VITAMIN D 25 HYDROXY (VIT D DEFICIENCY, FRACTURES): Vit D, 25-Hydroxy: 38 ng/mL (ref 30.0–100.0)

## 2020-02-26 ENCOUNTER — Ambulatory Visit: Payer: BC Managed Care – PPO | Admitting: Obstetrics and Gynecology

## 2020-03-04 ENCOUNTER — Telehealth: Payer: Self-pay

## 2020-03-04 NOTE — Telephone Encounter (Signed)
If symptoms persist over the next week, she can try taking the medication at night.

## 2020-03-04 NOTE — Telephone Encounter (Signed)
Spoke with patient. Patient started Celexa 20 mg tab, she is taking 1/2 of a tablet, 10 mg PO daily, started 2 days ago. 1 mo f/u scheduled for 04/06/20 at 1pm with Dr. Oscar La.   Patient reports medication is effective for anxiety. She is taking medication in the morning. States she is noticing that she getting drowsy around mid afternoon, this does wear off. States she is used to having more energy. Asking if any changes should be made at this time? Advised feeling sleepy can be a common side effect of medication, you should continue to monitor and if this does not improve or gets worse you should notify the office. Keep f/u as scheduled.  Advised I will update provider and return call if any additional recommendations. Patient agreeable.   Routing to Dr. Oscar La for final review.

## 2020-03-04 NOTE — Telephone Encounter (Signed)
Appointment Request From: [facility]    With Provider: Romualdo Bolk, MD [facility]]    Preferred Date Range: 03/31/2020 - 04/02/2020    Preferred Times: Any Time    Reason for visit: Office Visit    Comments:  She asked me to do a one month medicine follow up

## 2020-03-05 NOTE — Telephone Encounter (Signed)
Call to patient. Left detailed message, ok per dpr. Advised per Dr. Oscar La, return call to office if any additional questions/concerns.   Encounter closed.

## 2020-03-18 ENCOUNTER — Encounter: Payer: Self-pay | Admitting: Obstetrics and Gynecology

## 2020-03-18 ENCOUNTER — Telehealth: Payer: Self-pay

## 2020-03-18 DIAGNOSIS — Z20822 Contact with and (suspected) exposure to covid-19: Secondary | ICD-10-CM | POA: Diagnosis not present

## 2020-03-18 NOTE — Telephone Encounter (Signed)
Routing to Dr Oscar La.  Please advise on new referral for therapist.

## 2020-03-18 NOTE — Telephone Encounter (Signed)
Pt sent the following mychart message:  Lydia Torres, [facility] Hi! Do you have another therapist that you recommend? Chelle has been very difficult to connect with (Ive been trying to get in touch with her for 2 weeks - I will call her when she asks and it will take her days to return my call, which I miss because Im at work). I really just need someone who will schedule an appointment for me as Id like to get this started before the craziness of the holidays. Let me know if you have anyone else you recommend!   Medicine is helping SO MUCH!! Thank you for your help!!   [name]

## 2020-03-19 NOTE — Telephone Encounter (Signed)
Spoke with pt. Pt given new providers for therapist per list from Dr Oscar La. Pt agreeable to try other places.  Advised will send providers we discussed through mychart for pt to call for appt. Pt verbalized understanding.  Encounter closed

## 2020-03-19 NOTE — Telephone Encounter (Signed)
I've left a list on your desk.

## 2020-04-06 ENCOUNTER — Ambulatory Visit (INDEPENDENT_AMBULATORY_CARE_PROVIDER_SITE_OTHER): Payer: BC Managed Care – PPO | Admitting: Obstetrics and Gynecology

## 2020-04-06 ENCOUNTER — Other Ambulatory Visit: Payer: Self-pay

## 2020-04-06 ENCOUNTER — Encounter: Payer: Self-pay | Admitting: Obstetrics and Gynecology

## 2020-04-06 VITALS — BP 122/60 | HR 54 | Ht 65.0 in | Wt 161.4 lb

## 2020-04-06 DIAGNOSIS — Z79899 Other long term (current) drug therapy: Secondary | ICD-10-CM | POA: Diagnosis not present

## 2020-04-06 DIAGNOSIS — Z8659 Personal history of other mental and behavioral disorders: Secondary | ICD-10-CM | POA: Diagnosis not present

## 2020-04-06 MED ORDER — CITALOPRAM HYDROBROMIDE 20 MG PO TABS: 20.0000 mg | ORAL_TABLET | Freq: Every day | ORAL | 3 refills | Status: DC

## 2020-04-06 NOTE — Progress Notes (Signed)
GYNECOLOGY  VISIT   HPI: 44 y.o.   Married White or Caucasian Not Hispanic or Latino  female   [phone] with No LMP recorded. (Menstrual status: IUD).   here for one moth med follow up after starting celexa. Patient states that she is feeling she is doing really well on her Celexa.   She was started on Celexa ~5 weeks ago secondary to anxiety. She is on the 20 mg dose and is doing well. Her anxiety is well controlled.  She has had some looser stools with the Celexa, she started taking a probiotic and it is helping. She is sleeping better. She feels back to herself.  Her daughter is also doing better.   GYNECOLOGIC HISTORY: No LMP recorded. (Menstrual status: IUD). Contraception:IUD  Menopausal hormone therapy: none         OB History    Gravida  2   Para  2   Term  2   Preterm      AB      Living  2     SAB      TAB      Ectopic      Multiple      Live Births  2              Patient Active Problem List   Diagnosis Date Noted  . IUD (intrauterine device) in place, Mirena inserted 06/12/16 07/03/2017  . History of cold sores, HSV-1 07/03/2017    Past Medical History:  Diagnosis Date  . Abnormal uterine bleeding   . Chicken pox   . Dysmenorrhea   . HSV-1 infection   . Recurrent vaginal yeast infection   . Seasonal allergies     Past Surgical History:  Procedure Laterality Date  . BUNIONECTOMY Right 05/2015  . WISDOM TOOTH EXTRACTION      Current Outpatient Medications  Medication Sig Dispense Refill  . b complex vitamins capsule Take 1 capsule by mouth daily.    . Cholecalciferol (VITAMIN D3) 250 MCG (10000 UT) TABS Take by mouth.    . citalopram (CELEXA) 20 MG tablet Take 1 tablet (20 mg total) by mouth daily. Take 1/2 tablet qd x 1 week, if tolerating then increase to 1 tablet a day. 90 tablet 3  . cyclobenzaprine (FLEXERIL) 10 MG tablet Take 1 tablet (10 mg total) by mouth 3 (three) times daily as needed for muscle spasms. 30 tablet 0  .  diclofenac (VOLTAREN) 75 MG EC tablet Take 75 mg by mouth 2 (two) times daily as needed.     Marland Kitchen levonorgestrel (MIRENA) 20 MCG/24HR IUD 1 each by Intrauterine route once. Reported on 05/12/2015    . magnesium gluconate (MAGONATE) 500 MG tablet Take 500 mg by mouth 2 (two) times daily.    . valACYclovir (VALTREX) 1000 MG tablet Take 2 tablets (2000 mg) by mouth every 12 hours for 2 doses. 30 tablet 1   No current facility-administered medications for this visit.     ALLERGIES: Oxycodone, Codeine, Phenergan [promethazine hcl], and Sulfa antibiotics  Family History  Problem Relation Age of Onset  . Diabetes Mother   . Depression Mother   . Anxiety disorder Mother   . Liver disease Father   . Hypertension Father   . Prostate cancer Father 54  . Schizophrenia Brother   . Anxiety disorder Brother   . Depression Brother   . Breast cancer Paternal Aunt        2 paternal aunts with breast CA -  BrCA negative  . Breast cancer Paternal Aunt     Social History   Socioeconomic History  . Marital status: Married    Spouse name: Not on file  . Number of children: Not on file  . Years of education: Not on file  . Highest education level: Not on file  Occupational History    Employer: [employer]  Tobacco Use  . Smoking status: Never Smoker  . Smokeless tobacco: Never Used  Vaping Use  . Vaping Use: Never used  Substance and Sexual Activity  . Alcohol use: Yes    Alcohol/week: 3.0 - 4.0 standard drinks    Types: 3 - 4 Glasses of wine per week  . Drug use: Never  . Sexual activity: Yes    Partners: Male    Birth control/protection: I.U.D.  Other Topics Concern  . Not on file  Social History Narrative  . Not on file   Social Determinants of Health   Financial Resource Strain:   . Difficulty of Paying Living Expenses: Not on file  Food Insecurity:   . Worried About Charity fundraiser in the Last Year: Not on file  . Ran Out of Food in the Last Year: Not on file  Transportation  Needs:   . Lack of Transportation (Medical): Not on file  . Lack of Transportation (Non-Medical): Not on file  Physical Activity:   . Days of Exercise per Week: Not on file  . Minutes of Exercise per Session: Not on file  Stress:   . Feeling of Stress : Not on file  Social Connections:   . Frequency of Communication with Friends and Family: Not on file  . Frequency of Social Gatherings with Friends and Family: Not on file  . Attends Religious Services: Not on file  . Active Member of Clubs or Organizations: Not on file  . Attends Archivist Meetings: Not on file  . Marital Status: Not on file  Intimate Partner Violence:   . Fear of Current or Ex-Partner: Not on file  . Emotionally Abused: Not on file  . Physically Abused: Not on file  . Sexually Abused: Not on file    Review of Systems  All other systems reviewed and are negative.   PHYSICAL EXAMINATION:    BP 122/60   Pulse (!) 54   Ht _0  (1.651 m)   Wt 161 lb 6.4 oz (73.2 kg)   SpO2 100%   BMI 26.86 kg/m     General appearance: alert, cooperative and appears stated age  ASSESSMENT H/O anxiety, improved on the [place] Call with any concerns

## 2020-04-13 ENCOUNTER — Encounter: Payer: Self-pay | Admitting: Physician Assistant

## 2020-04-14 ENCOUNTER — Other Ambulatory Visit: Payer: Self-pay | Admitting: Physician Assistant

## 2020-04-14 DIAGNOSIS — Z1152 Encounter for screening for COVID-19: Secondary | ICD-10-CM | POA: Diagnosis not present

## 2020-04-14 DIAGNOSIS — Z03818 Encounter for observation for suspected exposure to other biological agents ruled out: Secondary | ICD-10-CM | POA: Diagnosis not present

## 2020-04-14 DIAGNOSIS — R051 Acute cough: Secondary | ICD-10-CM | POA: Diagnosis not present

## 2020-04-14 DIAGNOSIS — J029 Acute pharyngitis, unspecified: Secondary | ICD-10-CM | POA: Diagnosis not present

## 2020-04-14 MED ORDER — BENZONATATE 100 MG PO CAPS: 100.0000 mg | ORAL_CAPSULE | Freq: Two times a day (BID) | ORAL | 0 refills | Status: DC | PRN

## 2020-04-21 DIAGNOSIS — Z20822 Contact with and (suspected) exposure to covid-19: Secondary | ICD-10-CM | POA: Diagnosis not present

## 2020-10-18 ENCOUNTER — Other Ambulatory Visit: Payer: Self-pay | Admitting: Obstetrics and Gynecology

## 2020-10-18 ENCOUNTER — Encounter: Payer: Self-pay | Admitting: Obstetrics and Gynecology

## 2020-10-18 MED ORDER — ESCITALOPRAM OXALATE 10 MG PO TABS: 10.0000 mg | ORAL_TABLET | Freq: Every day | ORAL | 1 refills | Status: DC

## 2020-10-18 NOTE — Progress Notes (Signed)
See mychart message, patient requested change from celexa to lexapro.

## 2021-01-04 ENCOUNTER — Ambulatory Visit (INDEPENDENT_AMBULATORY_CARE_PROVIDER_SITE_OTHER): Payer: BC Managed Care – PPO | Admitting: Physician Assistant

## 2021-01-04 ENCOUNTER — Other Ambulatory Visit: Payer: Self-pay

## 2021-01-04 ENCOUNTER — Other Ambulatory Visit: Payer: Self-pay | Admitting: Physician Assistant

## 2021-01-04 ENCOUNTER — Encounter: Payer: Self-pay | Admitting: Physician Assistant

## 2021-01-04 VITALS — BP 110/70 | HR 55 | Temp 97.8°F | Ht 65.0 in | Wt 169.2 lb

## 2021-01-04 DIAGNOSIS — M79672 Pain in left foot: Secondary | ICD-10-CM | POA: Diagnosis not present

## 2021-01-04 DIAGNOSIS — Z23 Encounter for immunization: Secondary | ICD-10-CM | POA: Diagnosis not present

## 2021-01-04 DIAGNOSIS — Z1231 Encounter for screening mammogram for malignant neoplasm of breast: Secondary | ICD-10-CM

## 2021-01-04 NOTE — Progress Notes (Addendum)
Lydia Torres is a 45 y.o. female here for a new problem.  I acted as a Education administrator for [employer], PA-C [employer], LPN   History of Present Illness:   Chief Complaint  Patient presents with   Foot Pain    HPI   Foot pain Pt c/o left foot pain, stepped on something on Sunday. She is unsure of what she may have stepped on.  Pt tried to get out but unsuccessful. Had a little bit of clear fluid this morning when she squeezed the area. Has significant pain when walking. Denies: fever, chills, pus-like discharge, malaise.  She would like to update her tetanus vaccine as well today.   Past Medical History:  Diagnosis Date   Abnormal uterine bleeding    Chicken pox    Dysmenorrhea    HSV-1 infection    Recurrent vaginal yeast infection    Seasonal allergies      Social History   Tobacco Use   Smoking status: Never   Smokeless tobacco: Never  Vaping Use   Vaping Use: Never used  Substance Use Topics   Alcohol use: Yes    Alcohol/week: 3.0 - 4.0 standard drinks    Types: 3 - 4 Glasses of wine per week   Drug use: Never    Past Surgical History:  Procedure Laterality Date   BUNIONECTOMY Right 05/2015   WISDOM TOOTH EXTRACTION      Family History  Problem Relation Age of Onset   Diabetes Mother    Depression Mother    Anxiety disorder Mother    Liver disease Father    Hypertension Father    Prostate cancer Father 17   Schizophrenia Brother    Anxiety disorder Brother    Depression Brother    Breast cancer Paternal Aunt        2 paternal aunts with breast CA - BrCA negative   Breast cancer Paternal Aunt     Allergies  Allergen Reactions   Oxycodone    Codeine Rash   Phenergan [Promethazine Hcl] Rash   Sulfa Antibiotics Rash    Current Medications:   Current Outpatient Medications:    b complex vitamins capsule, Take 1 capsule by mouth daily., Disp: , Rfl:    Cholecalciferol (VITAMIN D3) 250 MCG (10000 UT) TABS, Take by mouth., Disp: , Rfl:     escitalopram (LEXAPRO) 10 MG tablet, Take 1 tablet (10 mg total) by mouth daily., Disp: 90 tablet, Rfl: 1   levonorgestrel (MIRENA) 20 MCG/24HR IUD, 1 each by Intrauterine route once. Reported on 05/12/2015, Disp: , Rfl:    magnesium gluconate (MAGONATE) 500 MG tablet, Take 500 mg by mouth 2 (two) times daily., Disp: , Rfl:    valACYclovir (VALTREX) 1000 MG tablet, Take 2 tablets (2000 mg) by mouth every 12 hours for 2 doses., Disp: 30 tablet, Rfl: 1   Review of Systems:   ROS Negative unless otherwise specified per HPI.  Vitals:   Vitals:   01/04/21 0941  BP: 110/70  Pulse: (!) 55  Temp: 97.8 F (36.6 C)  TempSrc: Temporal  SpO2: 99%  Weight: 169 lb 4 oz (76.8 kg)  Height: 5' 5"  (1.651 m)     Body mass index is 28.16 kg/m.  Physical Exam:   Physical Exam Constitutional:      Appearance: Normal appearance. She is well-developed.  HENT:     Head: Normocephalic and atraumatic.  Eyes:     General: Lids are normal.     Extraocular  Movements: Extraocular movements intact.     Conjunctiva/sclera: Conjunctivae normal.  Pulmonary:     Effort: Pulmonary effort is normal.  Musculoskeletal:        General: Normal range of motion.     Cervical back: Normal range of motion and neck supple.       Feet:  Skin:    General: Skin is warm and dry.  Neurological:     Mental Status: She is alert and oriented to person, place, and time.  Psychiatric:        Attention and Perception: Attention and perception normal.        Mood and Affect: Mood normal.        Behavior: Behavior normal.        Thought Content: Thought content normal.        Judgment: Judgment normal.   Diagnosis: Foreign Body - Location: L foot Procedure: Foreign body removal Type of extraction: simple No repair indicated  Informed consent:  Discussed the risks (permanent scarring, light or dark discoloration, infection, pain, bleeding, bruising, redness, blister formation, and recurrence of the lesion) and the  benefits of the procedure, as well as the alternatives.  Informed consent was obtained. Anesthesia: lidcoaine with eip The area was prepared and draped in a standard fashion. An incision was made in the skin overlying the implantation site. and It appeared to be without any obvious findings. Meticulous hemostasis was obtained with pressure. Ointment and dressing were applied. The specimen was sent for pathologic examination.  The patient tolerated the procedure well. The patient was instructed on post-op care.     Assessment and Plan:   1. Left foot pain -No obvious findings of foreign body -Minimal exploration today to prevent further worsening pain or development of infection -If worsening sx or return of sx, will refer to her back to her podiatrist, Dr. Paulla Dolly -- reviewed signs/sx of infection -Area covered and bandaged -Update tetanus today  CMA or LPN served as scribe during this visit. History, Physical, and Plan performed by medical provider. The above documentation has been reviewed and is accurate and complete.  Inda Coke, PA-C

## 2021-01-04 NOTE — Addendum Note (Signed)
Addended by: Jimmye Norman on: 01/04/2021 11:49 AM   Modules accepted: Orders

## 2021-02-11 ENCOUNTER — Other Ambulatory Visit: Payer: Self-pay

## 2021-02-11 ENCOUNTER — Ambulatory Visit
Admission: RE | Admit: 2021-02-11 | Discharge: 2021-02-11 | Disposition: A | Discharge status: Home / Self Care | Payer: BC Managed Care – PPO | Source: Ambulatory Visit

## 2021-02-11 DIAGNOSIS — Z1231 Encounter for screening mammogram for malignant neoplasm of breast: Secondary | ICD-10-CM

## 2021-03-02 NOTE — Progress Notes (Signed)
45 y.o. G46P2002 Married White or Caucasian Not Hispanic or Latino female here for annual exam.  Has a mirena IUD, inserted in 2/18. No cycles. No dyspareunia.   Mild and tolerable GSI.    On lexapro for anxiety. Only taking 5 mg. Doing well on it.   H/O oral hsv, takes valtrex prn.   No LMP recorded. (Menstrual status: IUD).          Sexually active: Yes.    The current method of family planning is IUD.    Exercising: Yes.     Walking  Smoker:  no  Health Maintenance: Pap:  11-27-16 Neg:Neg HR HPV, 07-25-13 Neg History of abnormal Pap:  no MMG:  02/17/21 Density C Bi-rads 1 neg  BMD:   n/a Colonoscopy: n/a TDaP:  2015  Gardasil: no    reports that she has never smoked. She has never used smokeless tobacco. She reports current alcohol use of about 3.0 - 4.0 standard drinks per week. She reports that she does not use drugs. She is a drug rep. 66 year daughter and 73 year old son. Daughter has a h/o anxiety, ADHD, doing well.   Past Medical History:  Diagnosis Date   Abnormal uterine bleeding    Chicken pox    Dysmenorrhea    HSV-1 infection    Recurrent vaginal yeast infection    Seasonal allergies     Past Surgical History:  Procedure Laterality Date   BUNIONECTOMY Right 05/2015   WISDOM TOOTH EXTRACTION      Current Outpatient Medications  Medication Sig Dispense Refill   b complex vitamins capsule Take 1 capsule by mouth daily.     Cholecalciferol (VITAMIN D3) 250 MCG (10000 UT) TABS Take by mouth.     escitalopram (LEXAPRO) 10 MG tablet Take 1 tablet (10 mg total) by mouth daily. 90 tablet 1   levonorgestrel (MIRENA) 20 MCG/24HR IUD 1 each by Intrauterine route once. Reported on 05/12/2015     magnesium gluconate (MAGONATE) 500 MG tablet Take 500 mg by mouth 2 (two) times daily.     valACYclovir (VALTREX) 1000 MG tablet Take 2 tablets (2000 mg) by mouth every 12 hours for 2 doses. 30 tablet 1   No current facility-administered medications for this visit.    Family  History  Problem Relation Age of Onset   Diabetes Mother    Depression Mother    Anxiety disorder Mother    Liver disease Father    Hypertension Father    Prostate cancer Father 2   Schizophrenia Brother    Anxiety disorder Brother    Depression Brother    Breast cancer Paternal Aunt        2 paternal aunts with breast CA - BrCA negative   Breast cancer Paternal Aunt     Review of Systems  All other systems reviewed and are negative.  Exam:   BP 100/62   Pulse 76   Ht 5' 5"  (1.651 m)   Wt 168 lb (76.2 kg)   SpO2 100%   BMI 27.96 kg/m   Weight change: @WEIGHTCHANGE @ Height:   Height: 5' 5"  (165.1 cm)  Ht Readings from Last 3 Encounters:  03/03/21 5' 5"  (1.651 m)  01/04/21 5' 5"  (1.651 m)  04/06/20 5' 5"  (1.651 m)    General appearance: alert, cooperative and appears stated age Head: Normocephalic, without obvious abnormality, atraumatic Neck: no adenopathy, supple, symmetrical, trachea midline and thyroid normal to inspection and palpation Lungs: clear to auscultation bilaterally Cardiovascular:  regular rate and rhythm Breasts: normal appearance, no masses or tenderness Abdomen: soft, non-tender; non distended,  no masses,  no organomegaly Extremities: extremities normal, atraumatic, no cyanosis or edema Skin: Skin color, texture, turgor normal. No rashes or lesions Lymph nodes: Cervical, supraclavicular, and axillary nodes normal. No abnormal inguinal nodes palpated Neurologic: Grossly normal   Pelvic: External genitalia:  no lesions              Urethra:  normal appearing urethra with no masses, tenderness or lesions              Bartholins and Skenes: normal                 Vagina: normal appearing vagina with normal color and discharge, no lesions              Cervix: no lesions and IUD string 3 cm               Bimanual Exam:  Uterus:  normal size, contour, position, consistency, mobility, non-tender and retroverted              Adnexa: no mass, fullness,  tenderness               Rectovaginal: Confirms               Anus:  normal sphincter tone, no lesions  Gae Dry chaperoned for the exam.  1. Well woman exam Discussed breast self exam Discussed calcium and vit D intake Mammogram utd  2. Screening for cervical cancer - Cytology - PAP  3. Laboratory exam ordered as part of routine general medical examination - CBC - Comprehensive metabolic panel - Lipid panel  4. History of anxiety - escitalopram (LEXAPRO) 5 MG tablet; Take 1 tablet (5 mg total) by mouth daily.  Dispense: 90 tablet; Refill: 3  5. History of cold sores, HSV-1 - valACYclovir (VALTREX) 1000 MG tablet; Take 2 tablets (2000 mg) by mouth every 12 hours for 2 doses.  Dispense: 30 tablet; Refill: 1

## 2021-03-03 ENCOUNTER — Other Ambulatory Visit (HOSPITAL_COMMUNITY)
Admission: RE | Admit: 2021-03-03 | Discharge: 2021-03-03 | Disposition: A | Discharge status: Home / Self Care | Payer: BC Managed Care – PPO | Source: Ambulatory Visit | Attending: Obstetrics and Gynecology | Admitting: Obstetrics and Gynecology

## 2021-03-03 ENCOUNTER — Ambulatory Visit (INDEPENDENT_AMBULATORY_CARE_PROVIDER_SITE_OTHER): Payer: BC Managed Care – PPO | Admitting: Obstetrics and Gynecology

## 2021-03-03 ENCOUNTER — Other Ambulatory Visit: Payer: Self-pay

## 2021-03-03 ENCOUNTER — Ambulatory Visit: Payer: BC Managed Care – PPO | Admitting: Obstetrics and Gynecology

## 2021-03-03 ENCOUNTER — Encounter: Payer: Self-pay | Admitting: Obstetrics and Gynecology

## 2021-03-03 VITALS — BP 100/62 | HR 76 | Ht 65.0 in | Wt 168.0 lb

## 2021-03-03 DIAGNOSIS — Z8619 Personal history of other infectious and parasitic diseases: Secondary | ICD-10-CM

## 2021-03-03 DIAGNOSIS — Z124 Encounter for screening for malignant neoplasm of cervix: Secondary | ICD-10-CM

## 2021-03-03 DIAGNOSIS — Z8659 Personal history of other mental and behavioral disorders: Secondary | ICD-10-CM | POA: Diagnosis not present

## 2021-03-03 DIAGNOSIS — Z01419 Encounter for gynecological examination (general) (routine) without abnormal findings: Secondary | ICD-10-CM | POA: Diagnosis not present

## 2021-03-03 DIAGNOSIS — Z23 Encounter for immunization: Secondary | ICD-10-CM

## 2021-03-03 DIAGNOSIS — Z0000 Encounter for general adult medical examination without abnormal findings: Secondary | ICD-10-CM

## 2021-03-03 MED ORDER — VALACYCLOVIR HCL 1 G PO TABS: ORAL_TABLET | ORAL | 1 refills | Status: DC

## 2021-03-03 MED ORDER — ESCITALOPRAM OXALATE 5 MG PO TABS: 5.0000 mg | ORAL_TABLET | Freq: Every day | ORAL | 3 refills | Status: DC

## 2021-03-03 NOTE — Patient Instructions (Signed)
EXERCISE   We recommended that you start or continue a regular exercise program for good health. Physical activity is anything that gets your body moving, some is better than none. The CDC recommends 150 minutes per week of Moderate-Intensity Aerobic Activity and 2 or more days of Muscle Strengthening Activity.  Benefits of exercise are limitless: helps weight loss/weight maintenance, improves mood and energy, helps with depression and anxiety, improves sleep, tones and strengthens muscles, improves balance, improves bone density, protects from chronic conditions such as heart disease, high blood pressure and diabetes and so much more. To learn more visit: https://www.cdc.gov/physicalactivity/index.html  DIET: Good nutrition starts with a healthy diet of fruits, vegetables, whole grains, and lean protein sources. Drink plenty of water for hydration. Minimize empty calories, sodium, sweets. For more information about dietary recommendations visit: https://health.gov/our-work/nutrition-physical-activity/dietary-guidelines and https://www.myplate.gov/  ALCOHOL:  Women should limit their alcohol intake to no more than 7 drinks/beers/glasses of wine (combined, not each!) per week. Moderation of alcohol intake to this level decreases your risk of breast cancer and liver damage.  If you are concerned that you may have a problem, or your friends have told you they are concerned about your drinking, there are many resources to help. A well-known program that is free, effective, and available to all people all over the nation is Alcoholics Anonymous.  Check out this site to learn more: https://www.aa.org/   CALCIUM AND VITAMIN D:  Adequate intake of calcium and Vitamin D are recommended for bone health.  You should be getting between 1000-1200 mg of calcium and 800 units of Vitamin D daily between diet and supplements  PAP SMEARS:  Pap smears, to check for cervical cancer or precancers,  have traditionally been  done yearly, scientific advances have shown that most women can have pap smears less often.  However, every woman still should have a physical exam from her gynecologist every year. It will include a breast check, inspection of the vulva and vagina to check for abnormal growths or skin changes, a visual exam of the cervix, and then an exam to evaluate the size and shape of the uterus and ovaries. We will also provide age appropriate advice regarding health maintenance, like when you should have certain vaccines, screening for sexually transmitted diseases, bone density testing, colonoscopy, mammograms, etc.   MAMMOGRAMS:  All women over 40 years old should have a routine mammogram.   COLON CANCER SCREENING: Now recommend starting at age 45. At this time colonoscopy is not covered for routine screening until 50. There are take home tests that can be done between 45-49.   COLONOSCOPY:  Colonoscopy to screen for colon cancer is recommended for all women at age 50.  We know, you hate the idea of the prep.  We agree, BUT, having colon cancer and not knowing it is worse!!  Colon cancer so often starts as a polyp that can be seen and removed at colonscopy, which can quite literally save your life!  And if your first colonoscopy is normal and you have no family history of colon cancer, most women don't have to have it again for 10 years.  Once every ten years, you can do something that may end up saving your life, right?  We will be happy to help you get it scheduled when you are ready.  Be sure to check your insurance coverage so you understand how much it will cost.  It may be covered as a preventative service at no cost, but you should check   your particular policy.      Breast Self-Awareness Breast self-awareness means being familiar with how your breasts look and feel. It involves checking your breasts regularly and reporting any changes to your health care provider. Practicing breast self-awareness is  important. A change in your breasts can be a sign of a serious medical problem. Being familiar with how your breasts look and feel allows you to find any problems early, when treatment is more likely to be successful. All women should practice breast self-awareness, including women who have had breast implants. How to do a breast self-exam One way to learn what is normal for your breasts and whether your breasts are changing is to do a breast self-exam. To do a breast self-exam: Look for Changes  Remove all the clothing above your waist. Stand in front of a mirror in a room with good lighting. Put your hands on your hips. Push your hands firmly downward. Compare your breasts in the mirror. Look for differences between them (asymmetry), such as: Differences in shape. Differences in size. Puckers, dips, and bumps in one breast and not the other. Look at each breast for changes in your skin, such as: Redness. Scaly areas. Look for changes in your nipples, such as: Discharge. Bleeding. Dimpling. Redness. A change in position. Feel for Changes Carefully feel your breasts for lumps and changes. It is best to do this while lying on your back on the floor and again while sitting or standing in the shower or tub with soapy water on your skin. Feel each breast in the following way: Place the arm on the side of the breast you are examining above your head. Feel your breast with the other hand. Start in the nipple area and make  inch (2 cm) overlapping circles to feel your breast. Use the pads of your three middle fingers to do this. Apply light pressure, then medium pressure, then firm pressure. The light pressure will allow you to feel the tissue closest to the skin. The medium pressure will allow you to feel the tissue that is a little deeper. The firm pressure will allow you to feel the tissue close to the ribs. Continue the overlapping circles, moving downward over the breast until you feel your  ribs below your breast. Move one finger-width toward the center of the body. Continue to use the  inch (2 cm) overlapping circles to feel your breast as you move slowly up toward your collarbone. Continue the up and down exam using all three pressures until you reach your armpit.  Write Down What You Find  Write down what is normal for each breast and any changes that you find. Keep a written record with breast changes or normal findings for each breast. By writing this information down, you do not need to depend only on memory for size, tenderness, or location. Write down where you are in your menstrual cycle, if you are still menstruating. If you are having trouble noticing differences in your breasts, do not get discouraged. With time you will become more familiar with the variations in your breasts and more comfortable with the exam. How often should I examine my breasts? Examine your breasts every month. If you are breastfeeding, the best time to examine your breasts is after a feeding or after using a breast pump. If you menstruate, the best time to examine your breasts is 5-7 days after your period is over. During your period, your breasts are lumpier, and it may be more   difficult to notice changes. When should I see my health care provider? See your health care provider if you notice: A change in shape or size of your breasts or nipples. A change in the skin of your breast or nipples, such as a reddened or scaly area. Unusual discharge from your nipples. A lump or thick area that was not there before. Pain in your breasts. Anything that concerns you. Kegel Exercises Kegel exercises can help strengthen your pelvic floor muscles. The pelvic floor is a group of muscles that support your rectum, small intestine, and bladder. In females, pelvic floor muscles also help support the womb (uterus). These muscles help you control the flow of urine and stool. Kegel exercises are painless and  simple, and they do not require any equipment. Your provider may suggest Kegel exercises to: Improve bladder and bowel control. Improve sexual response. Improve weak pelvic floor muscles after surgery to remove the uterus (hysterectomy) or pregnancy (females). Improve weak pelvic floor muscles after prostate gland removal or surgery (males). Kegel exercises involve squeezing your pelvic floor muscles, which are the same muscles you squeeze when you try to stop the flow of urine or keep from passing gas. The exercises can be done while sitting, standing, or lying down, but it is best to vary your position. Exercises How to do Kegel exercises: Squeeze your pelvic floor muscles tight. You should feel a tight lift in your rectal area. If you are a female, you should also feel a tightness in your vaginal area. Keep your stomach, buttocks, and legs relaxed. Hold the muscles tight for up to 10 seconds. Breathe normally. Relax your muscles. Repeat as told by your health care provider. Repeat this exercise daily as told by your health care provider. Continue to do this exercise for at least 4-6 weeks, or for as long as told by your health care provider. You may be referred to a physical therapist who can help you learn more about how to do Kegel exercises. Depending on your condition, your health care provider may recommend: Varying how long you squeeze your muscles. Doing several sets of exercises every day. Doing exercises for several weeks. Making Kegel exercises a part of your regular exercise routine. This information is not intended to replace advice given to you by your health care provider. Make sure you discuss any questions you have with your health care provider. Document Revised: 04/07/2020 Document Reviewed: 12/05/2017 Elsevier Patient Education  2022 [employer].  

## 2021-03-04 LAB — LIPID PANEL
Cholesterol: 230 mg/dL — ABNORMAL HIGH (ref <200)
HDL: 99 mg/dL (ref >=50)
LDL Cholesterol (Calc): 110 mg/dL (calc) — ABNORMAL HIGH
Non-HDL Cholesterol (Calc): 131 mg/dL (calc) — ABNORMAL HIGH (ref <130)
Total CHOL/HDL Ratio: 2.3 (calc) (ref <5.0)
Triglycerides: 99 mg/dL (ref <150)

## 2021-03-04 LAB — CBC
HCT: 41.3 % (ref 35.0–45.0)
Hemoglobin: 14 g/dL (ref 11.7–15.5)
MCH: 31.6 pg (ref 27.0–33.0)
MCHC: 33.9 g/dL (ref 32.0–36.0)
MCV: 93.2 fL (ref 80.0–100.0)
MPV: 11.5 fL (ref 7.5–12.5)
Platelets: 332 10*3/uL (ref 140–400)
RBC: 4.43 10*6/uL (ref 3.80–5.10)
RDW: 11.4 % (ref 11.0–15.0)
WBC: 6.5 10*3/uL (ref 3.8–10.8)

## 2021-03-04 LAB — COMPREHENSIVE METABOLIC PANEL
AG Ratio: 2 (calc) (ref 1.0–2.5)
ALT: 23 U/L (ref 6–29)
AST: 21 U/L (ref 10–30)
Albumin: 4.5 g/dL (ref 3.6–5.1)
Alkaline phosphatase (APISO): 38 U/L (ref 31–125)
BUN: 9 mg/dL (ref 7–25)
CO2: 26 mmol/L (ref 20–32)
Calcium: 9.3 mg/dL (ref 8.6–10.2)
Chloride: 101 mmol/L (ref 98–110)
Creat: 0.75 mg/dL (ref 0.50–0.99)
Globulin: 2.2 g/dL (calc) (ref 1.9–3.7)
Glucose, Bld: 85 mg/dL (ref 65–99)
Potassium: 4.1 mmol/L (ref 3.5–5.3)
Sodium: 137 mmol/L (ref 135–146)
Total Bilirubin: 0.5 mg/dL (ref 0.2–1.2)
Total Protein: 6.7 g/dL (ref 6.1–8.1)

## 2021-03-07 LAB — CYTOLOGY - PAP
Comment: NEGATIVE
Diagnosis: NEGATIVE
High risk HPV: NEGATIVE

## 2021-03-17 ENCOUNTER — Other Ambulatory Visit: Payer: Self-pay | Admitting: Obstetrics and Gynecology

## 2021-03-17 DIAGNOSIS — Z8619 Personal history of other infectious and parasitic diseases: Secondary | ICD-10-CM

## 2021-05-05 DIAGNOSIS — Z23 Encounter for immunization: Secondary | ICD-10-CM | POA: Diagnosis not present

## 2021-07-14 ENCOUNTER — Other Ambulatory Visit: Payer: Self-pay

## 2021-07-14 ENCOUNTER — Ambulatory Visit (INDEPENDENT_AMBULATORY_CARE_PROVIDER_SITE_OTHER): Payer: BC Managed Care – PPO

## 2021-07-14 ENCOUNTER — Ambulatory Visit (INDEPENDENT_AMBULATORY_CARE_PROVIDER_SITE_OTHER): Payer: BC Managed Care – PPO | Admitting: Podiatry

## 2021-07-14 DIAGNOSIS — S9032XA Contusion of left foot, initial encounter: Secondary | ICD-10-CM

## 2021-07-14 DIAGNOSIS — Q66222 Congenital metatarsus adductus, left foot: Secondary | ICD-10-CM | POA: Diagnosis not present

## 2021-07-14 DIAGNOSIS — M84375A Stress fracture, left foot, initial encounter for fracture: Secondary | ICD-10-CM

## 2021-07-14 DIAGNOSIS — E559 Vitamin D deficiency, unspecified: Secondary | ICD-10-CM

## 2021-07-15 LAB — CALCIUM: Calcium: 9.5 mg/dL (ref 8.7–10.2)

## 2021-07-15 LAB — VITAMIN D 25 HYDROXY (VIT D DEFICIENCY, FRACTURES): Vit D, 25-Hydroxy: 34.1 ng/mL (ref 30.0–100.0)

## 2021-07-19 ENCOUNTER — Encounter: Payer: Self-pay | Admitting: Podiatry

## 2021-07-19 NOTE — Progress Notes (Signed)
?  Subjective:  ?Patient ID: Lydia Torres, female    DOB: 1976/04/12,  MRN: [id number] ? ?Chief Complaint  ?Patient presents with  ? Foot Pain  ?    np re est care/ possible stress fracture left foot  ? ? ?46 y.o. female presents with the above complaint. History confirmed with patient.'s been going on for about a month this worsened after exercise.  The top of the foot is felt swollen and slight bruising there feels like there is a lump there ? ?Objective:  ?Physical Exam: ?warm, good capillary refill, no trophic changes or ulcerative lesions, normal DP and PT pulses, and normal sensory exam. ?Left Foot:  Dorsal fourth metatarsal and base there is a palpable area of firmness and tenderness ? ?Radiographs: ?Multiple views x-ray of the left foot: No definitive fracture but there is cortical thickening of the fourth metatarsal proximally ?Assessment:  ? ?1. Stress fracture of metatarsal bone of left foot, initial encounter   ?2. Metatarsus adductus of left foot   ?3. Vitamin D deficiency   ? ? ? ?Plan:  ?Patient was evaluated and treated and all questions answered. ? ?Discussed with her she likely has a stress fracture developing.  She is in a surgical shoe that she previously had and she may continue WB in this.  I advised to avoid impact exercise or activity on the foot for now.  Order given for evaluation of vitamin D and calcium which was normal and does not require supplementation.She is planning to go to  vacation in a few weeks and I will see her back prior to this to reevaluate and take new x-rays ?Return in about 3 weeks (around 08/04/2021) for new left foot xrays .  ? ?

## 2021-07-26 ENCOUNTER — Other Ambulatory Visit: Payer: BC Managed Care – PPO

## 2021-08-04 ENCOUNTER — Ambulatory Visit (INDEPENDENT_AMBULATORY_CARE_PROVIDER_SITE_OTHER): Payer: BC Managed Care – PPO

## 2021-08-04 ENCOUNTER — Ambulatory Visit (INDEPENDENT_AMBULATORY_CARE_PROVIDER_SITE_OTHER): Payer: BC Managed Care – PPO | Admitting: Podiatry

## 2021-08-04 DIAGNOSIS — Q66222 Congenital metatarsus adductus, left foot: Secondary | ICD-10-CM | POA: Diagnosis not present

## 2021-08-04 DIAGNOSIS — M84375A Stress fracture, left foot, initial encounter for fracture: Secondary | ICD-10-CM

## 2021-08-07 NOTE — Progress Notes (Signed)
?  Subjective:  ?Patient ID: Lydia Torres, female    DOB: December 31, 1975,  MRN: [id number] ? ?Chief Complaint  ?Patient presents with  ? Fracture  ?  3 week follow up stress fracture left foot  ? ? ?46 y.o. female presents with the above complaint. History confirmed with patient.'s been going on for about a month this worsened after exercise.  The top of the foot is felt swollen and slight bruising there feels like there is a lump there ? ?Interval history: ?She returns for follow-up and has had quite a bit of improvement ? ?Objective:  ?Physical Exam: ?warm, good capillary refill, no trophic changes or ulcerative lesions, normal DP and PT pulses, and normal sensory exam. ?Left Foot:  Dorsal fourth metatarsal and base there is a palpable area of firmness and tenderness ? ?Radiographs: ?Multiple views x-ray of the left foot: New radiographs taken today show stable cortical thickening, no definitive fracture noted today. ?Assessment:  ? ?1. Stress fracture of metatarsal bone of left foot, initial encounter   ? ? ? ?Plan:  ?Patient was evaluated and treated and all questions answered. ? ?Overall is improved quite a bit.  She is going to [place] tomorrow for vacation and I advised her she may transition back to a regular supportive shoe gear and take her surgical shoe with her dislocation needs it.  Discussed I think long-term orthotics will benefit her.  She mostly wears dress shoes with work.  I would start with an athletic shoe insert to support her to her impact activities and then if helpful we will consider adding a second dress pair later.  She will call when she is ready to schedule to see our orthotist for this. ? ?Return if symptoms worsen or fail to improve.  ? ?

## 2021-10-07 DIAGNOSIS — J029 Acute pharyngitis, unspecified: Secondary | ICD-10-CM | POA: Diagnosis not present

## 2021-10-10 ENCOUNTER — Other Ambulatory Visit: Payer: Self-pay | Admitting: Obstetrics and Gynecology

## 2021-10-10 DIAGNOSIS — Z8619 Personal history of other infectious and parasitic diseases: Secondary | ICD-10-CM

## 2021-10-11 NOTE — Telephone Encounter (Signed)
Medication refill request: Valtrex  Last AEX:  03-03-21 JJ  Next AEX: not currently scheduled  Last MMG (if hormonal medication request): n/a Refill authorized: Today, please refill.   Medication pended for #90, 1RF. Please refill if appropriate.

## 2021-10-11 NOTE — Telephone Encounter (Signed)
Medication refill request: Valtrex  Last AEX:  03/03/21  Next AEX: none scheduled at this time  Last MMG (if hormonal medication request): 02/17/21 Refill authorized: Pharmacy request 180 vs 90

## 2021-11-30 DIAGNOSIS — C44612 Basal cell carcinoma of skin of right upper limb, including shoulder: Secondary | ICD-10-CM | POA: Diagnosis not present

## 2021-11-30 DIAGNOSIS — Q828 Other specified congenital malformations of skin: Secondary | ICD-10-CM | POA: Diagnosis not present

## 2021-11-30 DIAGNOSIS — D239 Other benign neoplasm of skin, unspecified: Secondary | ICD-10-CM | POA: Diagnosis not present

## 2021-11-30 DIAGNOSIS — D485 Neoplasm of uncertain behavior of skin: Secondary | ICD-10-CM | POA: Diagnosis not present

## 2021-12-06 ENCOUNTER — Other Ambulatory Visit (HOSPITAL_BASED_OUTPATIENT_CLINIC_OR_DEPARTMENT_OTHER): Payer: Self-pay

## 2021-12-06 MED ORDER — FLUOROURACIL 5 % EX CREA
TOPICAL_CREAM | CUTANEOUS | 0 refills | Status: DC
  Filled 2021-12-06: qty 40, 14d supply, fill #0

## 2021-12-09 ENCOUNTER — Other Ambulatory Visit (HOSPITAL_BASED_OUTPATIENT_CLINIC_OR_DEPARTMENT_OTHER): Payer: Self-pay

## 2022-01-03 ENCOUNTER — Other Ambulatory Visit (HOSPITAL_BASED_OUTPATIENT_CLINIC_OR_DEPARTMENT_OTHER): Payer: Self-pay

## 2022-01-03 ENCOUNTER — Other Ambulatory Visit: Payer: Self-pay | Admitting: Obstetrics and Gynecology

## 2022-01-03 DIAGNOSIS — L578 Other skin changes due to chronic exposure to nonionizing radiation: Secondary | ICD-10-CM | POA: Diagnosis not present

## 2022-01-03 DIAGNOSIS — Z1231 Encounter for screening mammogram for malignant neoplasm of breast: Secondary | ICD-10-CM

## 2022-01-03 DIAGNOSIS — D2272 Melanocytic nevi of left lower limb, including hip: Secondary | ICD-10-CM | POA: Diagnosis not present

## 2022-01-03 DIAGNOSIS — D485 Neoplasm of uncertain behavior of skin: Secondary | ICD-10-CM | POA: Diagnosis not present

## 2022-01-03 DIAGNOSIS — L821 Other seborrheic keratosis: Secondary | ICD-10-CM | POA: Diagnosis not present

## 2022-01-03 DIAGNOSIS — D1801 Hemangioma of skin and subcutaneous tissue: Secondary | ICD-10-CM | POA: Diagnosis not present

## 2022-01-03 MED ORDER — DESONIDE 0.05 % EX CREA
TOPICAL_CREAM | CUTANEOUS | 0 refills | Status: DC
  Filled 2022-01-03: qty 15, 7d supply, fill #0

## 2022-01-23 ENCOUNTER — Encounter: Payer: Self-pay | Admitting: *Deleted

## 2022-02-13 ENCOUNTER — Ambulatory Visit
Admission: RE | Admit: 2022-02-13 | Discharge: 2022-02-13 | Disposition: A | Discharge status: Home / Self Care | Payer: BC Managed Care – PPO | Source: Ambulatory Visit | Attending: Obstetrics and Gynecology | Admitting: Obstetrics and Gynecology

## 2022-02-13 DIAGNOSIS — Z1231 Encounter for screening mammogram for malignant neoplasm of breast: Secondary | ICD-10-CM

## 2022-02-21 DIAGNOSIS — M1712 Unilateral primary osteoarthritis, left knee: Secondary | ICD-10-CM | POA: Diagnosis not present

## 2022-02-28 NOTE — Progress Notes (Signed)
46 y.o. G90P2002 Married White or Caucasian Not Hispanic or Latino female here for annual exam.  She has a mirena IUD, placed in 2/18. No cycles, just occasional spotting. Sexually active, no pain.  H/O mild GSI, tolerable.   On low dose lexapro for anxiety, doing well.  H/O cold sores, takes valtrex prn.  C/O weight gain, just can't loose it.  Having intermittent night sweats.    No LMP recorded. (Menstrual status: IUD).          Sexually active: Yes.    The current method of family planning is IUD.   Inserted 06/12/16 Exercising: Yes.    The patient does not participate in regular exercise at present. Smoker:  no  Health Maintenance: Pap: 03/03/21 WNL Hr HPV neg   11-27-16 Neg:Neg HR HPV, 07-25-13 Neg History of abnormal Pap:  no MMG:02/14/22 density C Bi-rads  BMD:   none  Colonoscopy: n/a TDaP:  2015 Gardasil: no   reports that she has never smoked. She has never used smokeless tobacco. She reports current alcohol use of about 3.0 - 4.0 standard drinks of alcohol per week. She reports that she does not use drugs. She is a drug rep. 71 year daughter and 8 year old son.   Past Medical History:  Diagnosis Date   Abnormal uterine bleeding    Chicken pox    Dysmenorrhea    HSV-1 infection    Recurrent vaginal yeast infection    Seasonal allergies     Past Surgical History:  Procedure Laterality Date   BUNIONECTOMY Right 05/2015   WISDOM TOOTH EXTRACTION      Current Outpatient Medications  Medication Sig Dispense Refill   Cholecalciferol (VITAMIN D3) 250 MCG (10000 UT) TABS Take by mouth.     escitalopram (LEXAPRO) 5 MG tablet Take 1 tablet (5 mg total) by mouth daily. 90 tablet 3   levonorgestrel (MIRENA) 20 MCG/24HR IUD 1 each by Intrauterine route once. Reported on 05/12/2015     magnesium gluconate (MAGONATE) 500 MG tablet Take 500 mg by mouth 2 (two) times daily.     valACYclovir (VALTREX) 1000 MG tablet TAKE 2 TABLETS (2000 MG) BY MOUTH EVERY 12 HOURS FOR 2 DOSES.  30 tablet 1   b complex vitamins capsule Take 1 capsule by mouth daily. (Patient not taking: Reported on 03/07/2022)     desonide (DESOWEN) 4.19 % cream 1 application Externally once a day 7 days (Patient not taking: Reported on 03/07/2022) 15 g 0   No current facility-administered medications for this visit.    Family History  Problem Relation Age of Onset   Diabetes Mother    Depression Mother    Anxiety disorder Mother    Liver disease Father    Hypertension Father    Prostate cancer Father 41   Schizophrenia Brother    Anxiety disorder Brother    Depression Brother    Breast cancer Paternal Aunt        2 paternal aunts with breast CA - BrCA negative   Breast cancer Paternal Aunt     Review of Systems  All other systems reviewed and are negative.   Exam:   BP 126/68   Pulse 78   Ht _0  (1.651 m)   Wt 168 lb (76.2 kg)   SpO2 100%   BMI 27.96 kg/m   Weight change: _1 @ Height:   Height: _2  (165.1 cm)  Ht Readings from Last 3 Encounters:  03/07/22 _3  (1.651 m)  03/03/21  _0  (1.651 m)  01/04/21 _1  (1.651 m)    General appearance: alert, cooperative and appears stated age Head: Normocephalic, without obvious abnormality, atraumatic Neck: no adenopathy, supple, symmetrical, trachea midline and thyroid normal to inspection and palpation Lungs: clear to auscultation bilaterally Cardiovascular: regular rate and rhythm Breasts: normal appearance, no masses or tenderness Abdomen: soft, non-tender; non distended,  no masses,  no organomegaly Extremities: extremities normal, atraumatic, no cyanosis or edema Skin: Skin color, texture, turgor normal. No rashes or lesions Lymph nodes: Cervical, supraclavicular, and axillary nodes normal. No abnormal inguinal nodes palpated Neurologic: Grossly normal   Pelvic: External genitalia:  no lesions              Urethra:  normal appearing urethra with no masses, tenderness or lesions              Bartholins and  Skenes: normal                 Vagina: normal appearing vagina with normal color and discharge, no lesions              Cervix: no lesions               Bimanual Exam:  Uterus:  normal size, contour, position, consistency, mobility, non-tender              Adnexa: no mass, fullness, tenderness               Rectovaginal: Confirms               Anus:  normal sphincter tone, no lesions  Gae Dry, CMA chaperoned for the exam.  1. Well woman exam Discussed breast self exam Discussed calcium and vit D intake No pap this year Mammogram UTD  2. History of anxiety Doing well - escitalopram (LEXAPRO) 5 MG tablet; Take 1 tablet (5 mg total) by mouth daily.  Dispense: 90 tablet; Refill: 3  3. History of cold sores, HSV-1 - valACYclovir (VALTREX) 1000 MG tablet; Take 2 tablets (2,000 mg total) by mouth every 12 (twelve) hours for 2 doses.  Dispense: 30 tablet; Refill: 1  4. IUD (intrauterine device) in place, Mirena inserted 06/12/16 Doing well  5. Lipids abnormal - Lipid panel  6. Colon cancer screening - Ambulatory referral to Gastroenterology  7. Weight gain - TSH -discussed eating healthy, exercise, weight watchers, portion control  8. Night sweats - Follicle stimulating hormone

## 2022-03-07 ENCOUNTER — Ambulatory Visit (INDEPENDENT_AMBULATORY_CARE_PROVIDER_SITE_OTHER): Payer: BC Managed Care – PPO | Admitting: Obstetrics and Gynecology

## 2022-03-07 ENCOUNTER — Encounter: Payer: Self-pay | Admitting: Obstetrics and Gynecology

## 2022-03-07 ENCOUNTER — Other Ambulatory Visit (HOSPITAL_BASED_OUTPATIENT_CLINIC_OR_DEPARTMENT_OTHER): Payer: Self-pay

## 2022-03-07 VITALS — BP 126/68 | HR 78 | Ht 65.0 in | Wt 168.0 lb

## 2022-03-07 DIAGNOSIS — Z8619 Personal history of other infectious and parasitic diseases: Secondary | ICD-10-CM | POA: Diagnosis not present

## 2022-03-07 DIAGNOSIS — Z975 Presence of (intrauterine) contraceptive device: Secondary | ICD-10-CM | POA: Diagnosis not present

## 2022-03-07 DIAGNOSIS — R635 Abnormal weight gain: Secondary | ICD-10-CM | POA: Diagnosis not present

## 2022-03-07 DIAGNOSIS — Z8659 Personal history of other mental and behavioral disorders: Secondary | ICD-10-CM | POA: Diagnosis not present

## 2022-03-07 DIAGNOSIS — R61 Generalized hyperhidrosis: Secondary | ICD-10-CM | POA: Diagnosis not present

## 2022-03-07 DIAGNOSIS — Z01419 Encounter for gynecological examination (general) (routine) without abnormal findings: Secondary | ICD-10-CM

## 2022-03-07 DIAGNOSIS — Z1211 Encounter for screening for malignant neoplasm of colon: Secondary | ICD-10-CM

## 2022-03-07 DIAGNOSIS — E7889 Other lipoprotein metabolism disorders: Secondary | ICD-10-CM | POA: Diagnosis not present

## 2022-03-07 MED ORDER — VALACYCLOVIR HCL 1 G PO TABS
2000.0000 mg | ORAL_TABLET | Freq: Two times a day (BID) | ORAL | 1 refills | Status: AC
  Filled 2022-03-07: qty 30, 8d supply, fill #0

## 2022-03-07 MED ORDER — ESCITALOPRAM OXALATE 5 MG PO TABS
5.0000 mg | ORAL_TABLET | Freq: Every day | ORAL | 3 refills | Status: DC
  Filled 2022-03-07: qty 30, 30d supply, fill #0
  Filled 2022-04-04: qty 30, 30d supply, fill #1
  Filled 2022-06-02: qty 30, 30d supply, fill #2
  Filled 2022-07-03: qty 30, 30d supply, fill #3
  Filled 2022-08-24: qty 30, 30d supply, fill #4
  Filled 2022-10-17: qty 30, 30d supply, fill #5
  Filled 2022-12-06: qty 30, 30d supply, fill #6
  Filled 2023-02-02: qty 30, 30d supply, fill #7

## 2022-03-07 NOTE — Patient Instructions (Addendum)
Kegel Exercises  Kegel exercises can help strengthen your pelvic floor muscles. The pelvic floor is a group of muscles that support your rectum, small intestine, and bladder. In females, pelvic floor muscles also help support the uterus. These muscles help you control the flow of urine and stool (feces). Kegel exercises are painless and simple. They do not require any equipment. Your provider may suggest Kegel exercises to: Improve bladder and bowel control. Improve sexual response. Improve weak pelvic floor muscles after surgery to remove the uterus (hysterectomy) or after pregnancy, in females. Improve weak pelvic floor muscles after prostate gland removal or surgery, in males. Kegel exercises involve squeezing your pelvic floor muscles. These are the same muscles you squeeze when you try to stop the flow of urine or keep from passing gas. The exercises can be done while sitting, standing, or lying down, but it is best to vary your position. Ask your health care provider which exercises are safe for you. Do exercises exactly as told by your health care provider and adjust them as directed. Do not begin these exercises until told by your health care provider. Exercises How to do Kegel exercises: Squeeze your pelvic floor muscles tight. You should feel a tight lift in your rectal area. If you are a female, you should also feel a tightness in your vaginal area. Keep your stomach, buttocks, and legs relaxed. Hold the muscles tight for up to 10 seconds. Breathe normally. Relax your muscles for up to 10 seconds. Repeat as told by your health care provider. Repeat this exercise daily as told by your health care provider. Continue to do this exercise for at least 4-6 weeks, or for as long as told by your health care provider. You may be referred to a physical therapist who can help you learn more about how to do Kegel exercises. Depending on your condition, your health care provider may  recommend: Varying how long you squeeze your muscles. Doing several sets of exercises every day. Doing exercises for several weeks. Making Kegel exercises a part of your regular exercise routine. This information is not intended to replace advice given to you by your health care provider. Make sure you discuss any questions you have with your health care provider. Document Revised: 08/26/2020 Document Reviewed: 08/26/2020 Elsevier Patient Education  2023 Elsevier Inc. EXERCISE   We recommended that you start or continue a regular exercise program for good health. Physical activity is anything that gets your body moving, some is better than none. The CDC recommends 150 minutes per week of Moderate-Intensity Aerobic Activity and 2 or more days of Muscle Strengthening Activity.  Benefits of exercise are limitless: helps weight loss/weight maintenance, improves mood and energy, helps with depression and anxiety, improves sleep, tones and strengthens muscles, improves balance, improves bone density, protects from chronic conditions such as heart disease, high blood pressure and diabetes and so much more. To learn more visit: https://www.cdc.gov/physicalactivity/index.html  DIET: Good nutrition starts with a healthy diet of fruits, vegetables, whole grains, and lean protein sources. Drink plenty of water for hydration. Minimize empty calories, sodium, sweets. For more information about dietary recommendations visit: https://health.gov/our-work/nutrition-physical-activity/dietary-guidelines and https://www.myplate.gov/  ALCOHOL:  Women should limit their alcohol intake to no more than 7 drinks/beers/glasses of wine (combined, not each!) per week. Moderation of alcohol intake to this level decreases your risk of breast cancer and liver damage.  If you are concerned that you may have a problem, or your friends have told you they are concerned about your   drinking, there are many resources to help. A well-known  program that is free, effective, and available to all people all over the nation is Alcoholics Anonymous.  Check out this site to learn more: https://www.aa.org/   CALCIUM AND VITAMIN D:  Adequate intake of calcium and Vitamin D are recommended for bone health.  You should be getting between 1000-1200 mg of calcium and 800 units of Vitamin D daily between diet and supplements  PAP SMEARS:  Pap smears, to check for cervical cancer or precancers,  have traditionally been done yearly, scientific advances have shown that most women can have pap smears less often.  However, every woman still should have a physical exam from her gynecologist every year. It will include a breast check, inspection of the vulva and vagina to check for abnormal growths or skin changes, a visual exam of the cervix, and then an exam to evaluate the size and shape of the uterus and ovaries. We will also provide age appropriate advice regarding health maintenance, like when you should have certain vaccines, screening for sexually transmitted diseases, bone density testing, colonoscopy, mammograms, etc.   MAMMOGRAMS:  All women over 40 years old should have a routine mammogram.   COLON CANCER SCREENING: Now recommend starting at age 45. At this time colonoscopy is not covered for routine screening until 50. There are take home tests that can be done between 45-49.   COLONOSCOPY:  Colonoscopy to screen for colon cancer is recommended for all women at age 50.  We know, you hate the idea of the prep.  We agree, BUT, having colon cancer and not knowing it is worse!!  Colon cancer so often starts as a polyp that can be seen and removed at colonscopy, which can quite literally save your life!  And if your first colonoscopy is normal and you have no family history of colon cancer, most women don't have to have it again for 10 years.  Once every ten years, you can do something that may end up saving your life, right?  We will be happy to help  you get it scheduled when you are ready.  Be sure to check your insurance coverage so you understand how much it will cost.  It may be covered as a preventative service at no cost, but you should check your particular policy.      Breast Self-Awareness Breast self-awareness means being familiar with how your breasts look and feel. It involves checking your breasts regularly and reporting any changes to your health care provider. Practicing breast self-awareness is important. A change in your breasts can be a sign of a serious medical problem. Being familiar with how your breasts look and feel allows you to find any problems early, when treatment is more likely to be successful. All women should practice breast self-awareness, including women who have had breast implants. How to do a breast self-exam One way to learn what is normal for your breasts and whether your breasts are changing is to do a breast self-exam. To do a breast self-exam: Look for Changes  Remove all the clothing above your waist. Stand in front of a mirror in a room with good lighting. Put your hands on your hips. Push your hands firmly downward. Compare your breasts in the mirror. Look for differences between them (asymmetry), such as: Differences in shape. Differences in size. Puckers, dips, and bumps in one breast and not the other. Look at each breast for changes in your skin, such   as: Redness. Scaly areas. Look for changes in your nipples, such as: Discharge. Bleeding. Dimpling. Redness. A change in position. Feel for Changes Carefully feel your breasts for lumps and changes. It is best to do this while lying on your back on the floor and again while sitting or standing in the shower or tub with soapy water on your skin. Feel each breast in the following way: Place the arm on the side of the breast you are examining above your head. Feel your breast with the other hand. Start in the nipple area and make  inch  (2 cm) overlapping circles to feel your breast. Use the pads of your three middle fingers to do this. Apply light pressure, then medium pressure, then firm pressure. The light pressure will allow you to feel the tissue closest to the skin. The medium pressure will allow you to feel the tissue that is a little deeper. The firm pressure will allow you to feel the tissue close to the ribs. Continue the overlapping circles, moving downward over the breast until you feel your ribs below your breast. Move one finger-width toward the center of the body. Continue to use the  inch (2 cm) overlapping circles to feel your breast as you move slowly up toward your collarbone. Continue the up and down exam using all three pressures until you reach your armpit.  Write Down What You Find  Write down what is normal for each breast and any changes that you find. Keep a written record with breast changes or normal findings for each breast. By writing this information down, you do not need to depend only on memory for size, tenderness, or location. Write down where you are in your menstrual cycle, if you are still menstruating. If you are having trouble noticing differences in your breasts, do not get discouraged. With time you will become more familiar with the variations in your breasts and more comfortable with the exam. How often should I examine my breasts? Examine your breasts every month. If you are breastfeeding, the best time to examine your breasts is after a feeding or after using a breast pump. If you menstruate, the best time to examine your breasts is 5-7 days after your period is over. During your period, your breasts are lumpier, and it may be more difficult to notice changes. When should I see my health care provider? See your health care provider if you notice: A change in shape or size of your breasts or nipples. A change in the skin of your breast or nipples, such as a reddened or scaly area. Unusual  discharge from your nipples. A lump or thick area that was not there before. Pain in your breasts. Anything that concerns you.  

## 2022-03-08 LAB — LIPID PANEL
Cholesterol: 236 mg/dL — ABNORMAL HIGH (ref <200)
HDL: 105 mg/dL (ref >=50)
LDL Cholesterol (Calc): 112 mg/dL (calc) — ABNORMAL HIGH
Non-HDL Cholesterol (Calc): 131 mg/dL (calc) — ABNORMAL HIGH (ref <130)
Total CHOL/HDL Ratio: 2.2 (calc) (ref <5.0)
Triglycerides: 86 mg/dL (ref <150)

## 2022-03-08 LAB — FOLLICLE STIMULATING HORMONE: FSH: 8.4 m[IU]/mL

## 2022-03-08 LAB — TSH: TSH: 1.74 mIU/L

## 2022-03-27 ENCOUNTER — Encounter: Payer: Self-pay | Admitting: Internal Medicine

## 2022-04-03 ENCOUNTER — Ambulatory Visit (INDEPENDENT_AMBULATORY_CARE_PROVIDER_SITE_OTHER): Payer: BC Managed Care – PPO

## 2022-04-03 DIAGNOSIS — Q66222 Congenital metatarsus adductus, left foot: Secondary | ICD-10-CM

## 2022-04-03 DIAGNOSIS — M84375A Stress fracture, left foot, initial encounter for fracture: Secondary | ICD-10-CM

## 2022-04-03 DIAGNOSIS — Q66229 Congenital metatarsus adductus, unspecified foot: Secondary | ICD-10-CM

## 2022-04-03 DIAGNOSIS — Q66221 Congenital metatarsus adductus, right foot: Secondary | ICD-10-CM

## 2022-04-04 ENCOUNTER — Other Ambulatory Visit (HOSPITAL_BASED_OUTPATIENT_CLINIC_OR_DEPARTMENT_OTHER): Payer: Self-pay

## 2022-04-04 DIAGNOSIS — C44612 Basal cell carcinoma of skin of right upper limb, including shoulder: Secondary | ICD-10-CM | POA: Diagnosis not present

## 2022-04-04 DIAGNOSIS — Z23 Encounter for immunization: Secondary | ICD-10-CM | POA: Diagnosis not present

## 2022-04-10 ENCOUNTER — Other Ambulatory Visit (HOSPITAL_BASED_OUTPATIENT_CLINIC_OR_DEPARTMENT_OTHER): Payer: Self-pay

## 2022-04-19 ENCOUNTER — Ambulatory Visit: Admit: 2022-04-19 | Payer: BC Managed Care – PPO

## 2022-04-19 ENCOUNTER — Ambulatory Visit (AMBULATORY_SURGERY_CENTER): Payer: BC Managed Care – PPO

## 2022-04-19 ENCOUNTER — Other Ambulatory Visit (HOSPITAL_BASED_OUTPATIENT_CLINIC_OR_DEPARTMENT_OTHER): Payer: Self-pay

## 2022-04-19 VITALS — Ht 65.0 in | Wt 165.0 lb

## 2022-04-19 DIAGNOSIS — J029 Acute pharyngitis, unspecified: Secondary | ICD-10-CM | POA: Diagnosis not present

## 2022-04-19 DIAGNOSIS — J111 Influenza due to unidentified influenza virus with other respiratory manifestations: Secondary | ICD-10-CM | POA: Diagnosis not present

## 2022-04-19 DIAGNOSIS — J1083 Influenza due to other identified influenza virus with otitis media: Secondary | ICD-10-CM | POA: Diagnosis not present

## 2022-04-19 DIAGNOSIS — R051 Acute cough: Secondary | ICD-10-CM | POA: Diagnosis not present

## 2022-04-19 DIAGNOSIS — S83242A Other tear of medial meniscus, current injury, left knee, initial encounter: Secondary | ICD-10-CM | POA: Diagnosis not present

## 2022-04-19 DIAGNOSIS — Z1211 Encounter for screening for malignant neoplasm of colon: Secondary | ICD-10-CM

## 2022-04-19 DIAGNOSIS — M1712 Unilateral primary osteoarthritis, left knee: Secondary | ICD-10-CM | POA: Diagnosis not present

## 2022-04-19 MED ORDER — PREDNISONE 10 MG PO TABS
ORAL_TABLET | ORAL | 0 refills | Status: DC
  Filled 2022-04-19: qty 21, 6d supply, fill #0

## 2022-04-19 MED ORDER — NA SULFATE-K SULFATE-MG SULF 17.5-3.13-1.6 GM/177ML PO SOLN
1.0000 | Freq: Once | ORAL | 0 refills | Status: AC
  Filled 2022-04-19: qty 354, 1d supply, fill #0

## 2022-04-19 MED ORDER — OSELTAMIVIR PHOSPHATE 75 MG PO CAPS
75.0000 mg | ORAL_CAPSULE | Freq: Two times a day (BID) | ORAL | 0 refills | Status: DC
  Filled 2022-04-19: qty 10, 5d supply, fill #0

## 2022-04-19 NOTE — Progress Notes (Signed)
No egg or soy allergy known to patient  No issues known to pt with past sedation with any surgeries or procedures Patient denies ever being told they had issues or difficulty with intubation  No FH of Malignant Hyperthermia Pt is not on diet pills Pt is not on  home 02  Pt is not on blood thinners  Pt denies issues with constipation  No A fib or A flutter  Pt instructed to use Singlecare.com or GoodRx for a price reduction on prep   

## 2022-04-20 ENCOUNTER — Ambulatory Visit: Payer: BC Managed Care – PPO | Admitting: Family

## 2022-04-25 DIAGNOSIS — M25562 Pain in left knee: Secondary | ICD-10-CM | POA: Diagnosis not present

## 2022-05-03 DIAGNOSIS — M1712 Unilateral primary osteoarthritis, left knee: Secondary | ICD-10-CM | POA: Diagnosis not present

## 2022-05-10 DIAGNOSIS — M1712 Unilateral primary osteoarthritis, left knee: Secondary | ICD-10-CM | POA: Diagnosis not present

## 2022-05-17 ENCOUNTER — Encounter: Payer: Self-pay | Admitting: Internal Medicine

## 2022-05-17 DIAGNOSIS — M1712 Unilateral primary osteoarthritis, left knee: Secondary | ICD-10-CM | POA: Diagnosis not present

## 2022-05-18 ENCOUNTER — Ambulatory Visit (AMBULATORY_SURGERY_CENTER): Payer: BC Managed Care – PPO | Admitting: Internal Medicine

## 2022-05-18 ENCOUNTER — Encounter: Payer: Self-pay | Admitting: Internal Medicine

## 2022-05-18 VITALS — BP 95/57 | HR 50 | Temp 96.8°F | Resp 9 | Ht 65.0 in | Wt 165.0 lb

## 2022-05-18 DIAGNOSIS — Z1211 Encounter for screening for malignant neoplasm of colon: Secondary | ICD-10-CM

## 2022-05-18 MED ORDER — SODIUM CHLORIDE 0.9 % IV SOLN: 500.0000 mL | Freq: Once | INTRAVENOUS | Status: DC

## 2022-05-18 NOTE — Op Note (Signed)
[facility] Patient Name: Lydia Torres Procedure Date: 05/18/2022 9:51 AM MRN: [id number] Endoscopist: Gatha Mayer , MD, [id number] Age: 47 Referring MD:  Date of Birth: 03/04/1976 Gender: Female Account #: [id number] Procedure:                Colonoscopy Indications:              Screening for colorectal malignant neoplasm, This                            is the patient's first colonoscopy Medicines:                Monitored Anesthesia Care Procedure:                Pre-Anesthesia Assessment:                           - Prior to the procedure, a History and Physical                            was performed, and patient medications and                            allergies were reviewed. The patient's tolerance of                            previous anesthesia was also reviewed. The risks                            and benefits of the procedure and the sedation                            options and risks were discussed with the patient.                            All questions were answered, and informed consent                            was obtained. Prior Anticoagulants: The patient has                            taken no anticoagulant or antiplatelet agents. ASA                            Grade Assessment: I - A normal, healthy patient.                            After reviewing the risks and benefits, the patient                            was deemed in satisfactory condition to undergo the                            procedure.  After obtaining informed consent, the colonoscope                            was passed under direct vision. Throughout the                            procedure, the patient's blood pressure, pulse, and                            oxygen saturations were monitored continuously. The                            CF [id number] #[id number] was introduced through the anus                            and advanced to the the cecum,  identified by                            appendiceal orifice and ileocecal valve. The                            colonoscopy was performed without difficulty. The                            patient tolerated the procedure well. The quality                            of the bowel preparation was excellent. The                            ileocecal valve, appendiceal orifice, and rectum                            were photographed. The bowel preparation used was                            SUPREP via split dose instruction. Scope In: 9:56:26 AM Scope Out: 10:11:48 AM Scope Withdrawal Time: 0 hours 11 minutes 10 seconds  Total Procedure Duration: 0 hours 15 minutes 22 seconds  Findings:                 The perianal and digital rectal examinations were                            normal.                           The entire examined colon appeared normal on direct                            and retroflexion views. Complications:            No immediate complications. Estimated Blood Loss:     Estimated blood loss: none. Impression:               - The entire examined  colon is normal on direct and                            retroflexion views.                           - No specimens collected. Recommendation:           - Patient has a contact number available for                            emergencies. The signs and symptoms of potential                            delayed complications were discussed with the                            patient. Return to normal activities tomorrow.                            Written discharge instructions were provided to the                            patient.                           - Resume previous diet.                           - Continue present medications.                           - Repeat colonoscopy or other appropriate test in                            10 years for screening purposes. Gatha Mayer, MD 05/18/2022 10:20:39 AM This report has  been signed electronically.

## 2022-05-18 NOTE — Progress Notes (Signed)
[facility] History and Physical   Primary Care Physician:  Inda Coke, [place]   Reason for Procedure:   CRCA screening  Plan:    colonoscopy     HPI: Lydia Torres is a 47 y.o. female here for initial screening exam   Past Medical History:  Diagnosis Date   Abnormal uterine bleeding    Anxiety    Arthritis    Chicken pox    Dysmenorrhea    HSV-1 infection    Recurrent vaginal yeast infection    Seasonal allergies     Past Surgical History:  Procedure Laterality Date   BUNIONECTOMY Right 05/2015   WISDOM TOOTH EXTRACTION      Prior to Admission medications   Medication Sig Start Date End Date Taking? Authorizing Provider  b complex vitamins capsule Take 1 capsule by mouth daily.   Yes [provider]  escitalopram (LEXAPRO) 5 MG tablet Take 1 tablet (5 mg total) by mouth daily. 03/07/22  Yes Salvadore Dom, MD  EUFLEXXA 20 MG/2ML SOSY SMARTSIG:I-ARTIC 04/18/22  Yes [provider]  Omega-3 Fatty Acids (FISH OIL) 1000 MG CAPS Take 1,000 mg by mouth daily.   Yes [provider]  Cholecalciferol (VITAMIN D3) 250 MCG (10000 UT) TABS Take by mouth.    [provider]  levonorgestrel (MIRENA) 20 MCG/24HR IUD 1 each by Intrauterine route once. Reported on 05/12/2015    [provider]  magnesium gluconate (MAGONATE) 500 MG tablet Take 500 mg by mouth 2 (two) times daily.    [provider]  oseltamivir (TAMIFLU) 75 MG capsule Take 1 capsule (75 mg total) by mouth every 12 (twelve) hours for 5 days. 04/19/22       Current Outpatient Medications  Medication Sig Dispense Refill   b complex vitamins capsule Take 1 capsule by mouth daily.     escitalopram (LEXAPRO) 5 MG tablet Take 1 tablet (5 mg total) by mouth daily. 90 tablet 3   EUFLEXXA 20 MG/2ML SOSY SMARTSIG:I-ARTIC     Omega-3 Fatty Acids (FISH OIL) 1000 MG CAPS Take 1,000 mg by mouth daily.     Cholecalciferol (VITAMIN D3) 250 MCG (10000 UT)  TABS Take by mouth.     levonorgestrel (MIRENA) 20 MCG/24HR IUD 1 each by Intrauterine route once. Reported on 05/12/2015     magnesium gluconate (MAGONATE) 500 MG tablet Take 500 mg by mouth 2 (two) times daily.     Current Facility-Administered Medications  Medication Dose Route Frequency Provider Last Rate Last Admin   0.9 %  sodium chloride infusion  500 mL Intravenous Once Gatha Mayer, MD        Allergies as of 05/18/2022 - Review Complete 05/18/2022  Allergen Reaction Noted   Oxycodone  07/03/2017   Codeine Rash 11/04/2010   Phenergan [promethazine hcl] Rash 05/12/2015   Sulfa antibiotics Rash 05/12/2015    Family History  Problem Relation Age of Onset   Diabetes Mother    Depression Mother    Anxiety disorder Mother    Liver disease Father    Hypertension Father    Prostate cancer Father 56   Schizophrenia Brother    Anxiety disorder Brother    Depression Brother    Breast cancer Paternal Aunt        2 paternal aunts with breast CA - BrCA negative   Breast cancer Paternal Aunt    Colon cancer Neg Hx    Esophageal cancer Neg Hx    Stomach cancer Neg Hx  Rectal cancer Neg Hx     Social History   Socioeconomic History   Marital status: Married    Spouse name: Not on file   Number of children: Not on file   Years of education: Not on file   Highest education level: Not on file  Occupational History    Employer: [employer]  Tobacco Use   Smoking status: Never   Smokeless tobacco: Never  Vaping Use   Vaping Use: Never used  Substance and Sexual Activity   Alcohol use: Yes    Alcohol/week: 3.0 - 4.0 standard drinks of alcohol    Types: 3 - 4 Glasses of wine per week   Drug use: Never   Sexual activity: Yes    Partners: Male    Birth control/protection: I.U.D.  Other Topics Concern   Not on file  Social History Narrative   Not on file   Social Determinants of Health   Financial Resource Strain: Not on file  Food Insecurity: Not on file   Transportation Needs: Not on file  Physical Activity: Not on file  Stress: Not on file  Social Connections: Not on file  Intimate Partner Violence: Not on file    Review of Systems:  All other review of systems negative except as mentioned in the HPI.  Physical Exam: Vital signs BP (!) 97/54   Pulse (!) 59   Temp (!) 96.8 F (36 C) (Temporal)   Ht 5\' 5"  (1.651 m)   Wt 165 lb (74.8 kg)   SpO2 100%   BMI 27.46 kg/m   General:   Alert,  Well-developed, well-nourished, pleasant and cooperative in NAD Lungs:  Clear throughout to auscultation.   Heart:  Regular rate and rhythm; no murmurs, clicks, rubs,  or gallops. Abdomen:  Soft, nontender and nondistended. Normal bowel sounds.   Neuro/Psych:  Alert and cooperative. Normal mood and affect. A and O x 3   @Xiamara Hulet  Simonne Maffucci, MD, [facility] [phone] (pager) 05/18/2022 9:50 AM@

## 2022-05-18 NOTE — Progress Notes (Signed)
Pt awake, alert and oriented. VSS. Airway intact. SBAR complete to RN. All questions answered.  

## 2022-05-18 NOTE — Patient Instructions (Addendum)
Your colonoscopy exam was normal. No polyps or cancer were seen.  Next routine colonoscopy or other screening test in 10 years - 2034.  I appreciate the opportunity to care for you. Gatha Mayer, MD, [facility]   You may resume your previous diet and medication schedule.  Thank you for allowing [place] to care for you today!!!   YOU HAD AN ENDOSCOPIC PROCEDURE TODAY AT [facility]:   Refer to the procedure report that was given to you for any specific questions about what was found during the examination.  If the procedure report does not answer your questions, please call your gastroenterologist to clarify.  If you requested that your care partner not be given the details of your procedure findings, then the procedure report has been included in a sealed envelope for you to review at your convenience later.  YOU SHOULD EXPECT: Some feelings of bloating in the abdomen. Passage of more gas than usual.  Walking can help get rid of the air that was put into your GI tract during the procedure and reduce the bloating. If you had a lower endoscopy (such as a colonoscopy or flexible sigmoidoscopy) you may notice spotting of blood in your stool or on the toilet paper. If you underwent a bowel prep for your procedure, you may not have a normal bowel movement for a few days.  Please Note:  You might notice some irritation and congestion in your nose or some drainage.  This is from the oxygen used during your procedure.  There is no need for concern and it should clear up in a day or so.  SYMPTOMS TO REPORT IMMEDIATELY:  Following lower endoscopy (colonoscopy or flexible sigmoidoscopy):  Excessive amounts of blood in the stool  Significant tenderness or worsening of abdominal pains  Swelling of the abdomen that is new, acute  Fever of 100F or higher  For urgent or emergent issues, a gastroenterologist can be reached at any hour by calling [phone]. Do not use MyChart messaging for  urgent concerns.    DIET:  We do recommend a small meal at first, but then you may proceed to your regular diet.  Drink plenty of fluids but you should avoid alcoholic beverages for 24 hours.  ACTIVITY:  You should plan to take it easy for the rest of today and you should NOT DRIVE or use heavy machinery until tomorrow (because of the sedation medicines used during the test).    FOLLOW UP: Our staff will call the number listed on your records the next business day following your procedure.  We will call around 7:15- 8:00 am to check on you and address any questions or concerns that you may have regarding the information given to you following your procedure. If we do not reach you, we will leave a message.     If any biopsies were taken you will be contacted by phone or by letter within the next 1-3 weeks.  Please call us at [phone] if you have not heard about the biopsies in 3 weeks.    SIGNATURES/CONFIDENTIALITY: You and/or your care partner have signed paperwork which will be entered into your electronic medical record.  These signatures attest to the fact that that the information above on your After Visit Summary has been reviewed and is understood.  Full responsibility of the confidentiality of this discharge information lies with you and/or your care-partner.

## 2022-05-18 NOTE — Progress Notes (Signed)
VS completed by DT.  Pt's states no medical or surgical changes since previsit or office visit.  

## 2022-05-19 ENCOUNTER — Telehealth: Payer: Self-pay

## 2022-05-19 NOTE — Telephone Encounter (Signed)
Attempted to reach patient for post-procedure f/u call. No answer. Left message for her to please not hesitate to call if she has any questions/concerns regarding her care. 

## 2022-05-29 ENCOUNTER — Ambulatory Visit (INDEPENDENT_AMBULATORY_CARE_PROVIDER_SITE_OTHER): Payer: BC Managed Care – PPO

## 2022-05-29 DIAGNOSIS — M84375A Stress fracture, left foot, initial encounter for fracture: Secondary | ICD-10-CM

## 2022-05-29 DIAGNOSIS — Q66222 Congenital metatarsus adductus, left foot: Secondary | ICD-10-CM | POA: Diagnosis not present

## 2022-05-29 DIAGNOSIS — Q66221 Congenital metatarsus adductus, right foot: Secondary | ICD-10-CM | POA: Diagnosis not present

## 2022-05-29 NOTE — Progress Notes (Signed)
Patient presents today to be re-casted for custom molded orthotics. Dr Sherryle Lis is the treating physician.  Impression foam cast was taken.   Patient will be notified once orthotics arrive in office and reappoint for fitting at that time.

## 2022-06-01 DIAGNOSIS — M94262 Chondromalacia, left knee: Secondary | ICD-10-CM | POA: Diagnosis not present

## 2022-06-01 DIAGNOSIS — R531 Weakness: Secondary | ICD-10-CM | POA: Diagnosis not present

## 2022-06-02 ENCOUNTER — Other Ambulatory Visit (HOSPITAL_BASED_OUTPATIENT_CLINIC_OR_DEPARTMENT_OTHER): Payer: Self-pay

## 2022-06-20 DIAGNOSIS — R531 Weakness: Secondary | ICD-10-CM | POA: Diagnosis not present

## 2022-06-20 DIAGNOSIS — M94262 Chondromalacia, left knee: Secondary | ICD-10-CM | POA: Diagnosis not present

## 2022-06-21 DIAGNOSIS — G4486 Cervicogenic headache: Secondary | ICD-10-CM | POA: Diagnosis not present

## 2022-06-21 DIAGNOSIS — M9902 Segmental and somatic dysfunction of thoracic region: Secondary | ICD-10-CM | POA: Diagnosis not present

## 2022-06-21 DIAGNOSIS — M9901 Segmental and somatic dysfunction of cervical region: Secondary | ICD-10-CM | POA: Diagnosis not present

## 2022-06-21 DIAGNOSIS — M9903 Segmental and somatic dysfunction of lumbar region: Secondary | ICD-10-CM | POA: Diagnosis not present

## 2022-06-24 DIAGNOSIS — M9906 Segmental and somatic dysfunction of lower extremity: Secondary | ICD-10-CM | POA: Diagnosis not present

## 2022-06-24 DIAGNOSIS — M9902 Segmental and somatic dysfunction of thoracic region: Secondary | ICD-10-CM | POA: Diagnosis not present

## 2022-06-24 DIAGNOSIS — M9905 Segmental and somatic dysfunction of pelvic region: Secondary | ICD-10-CM | POA: Diagnosis not present

## 2022-06-24 DIAGNOSIS — M9903 Segmental and somatic dysfunction of lumbar region: Secondary | ICD-10-CM | POA: Diagnosis not present

## 2022-06-24 DIAGNOSIS — M9901 Segmental and somatic dysfunction of cervical region: Secondary | ICD-10-CM | POA: Diagnosis not present

## 2022-06-24 DIAGNOSIS — G4486 Cervicogenic headache: Secondary | ICD-10-CM | POA: Diagnosis not present

## 2022-06-26 DIAGNOSIS — G4486 Cervicogenic headache: Secondary | ICD-10-CM | POA: Diagnosis not present

## 2022-06-26 DIAGNOSIS — M9905 Segmental and somatic dysfunction of pelvic region: Secondary | ICD-10-CM | POA: Diagnosis not present

## 2022-06-26 DIAGNOSIS — M9901 Segmental and somatic dysfunction of cervical region: Secondary | ICD-10-CM | POA: Diagnosis not present

## 2022-06-26 DIAGNOSIS — M9903 Segmental and somatic dysfunction of lumbar region: Secondary | ICD-10-CM | POA: Diagnosis not present

## 2022-06-26 DIAGNOSIS — M9902 Segmental and somatic dysfunction of thoracic region: Secondary | ICD-10-CM | POA: Diagnosis not present

## 2022-06-30 ENCOUNTER — Ambulatory Visit (INDEPENDENT_AMBULATORY_CARE_PROVIDER_SITE_OTHER): Payer: BC Managed Care – PPO

## 2022-06-30 DIAGNOSIS — M9903 Segmental and somatic dysfunction of lumbar region: Secondary | ICD-10-CM | POA: Diagnosis not present

## 2022-06-30 DIAGNOSIS — G4486 Cervicogenic headache: Secondary | ICD-10-CM | POA: Diagnosis not present

## 2022-06-30 DIAGNOSIS — M9901 Segmental and somatic dysfunction of cervical region: Secondary | ICD-10-CM | POA: Diagnosis not present

## 2022-06-30 DIAGNOSIS — M9902 Segmental and somatic dysfunction of thoracic region: Secondary | ICD-10-CM | POA: Diagnosis not present

## 2022-06-30 DIAGNOSIS — M84375A Stress fracture, left foot, initial encounter for fracture: Secondary | ICD-10-CM

## 2022-06-30 DIAGNOSIS — Q66222 Congenital metatarsus adductus, left foot: Secondary | ICD-10-CM

## 2022-06-30 DIAGNOSIS — M9905 Segmental and somatic dysfunction of pelvic region: Secondary | ICD-10-CM | POA: Diagnosis not present

## 2022-06-30 DIAGNOSIS — M9906 Segmental and somatic dysfunction of lower extremity: Secondary | ICD-10-CM | POA: Diagnosis not present

## 2022-07-03 ENCOUNTER — Telehealth: Payer: Self-pay | Admitting: Podiatry

## 2022-07-03 ENCOUNTER — Other Ambulatory Visit (HOSPITAL_BASED_OUTPATIENT_CLINIC_OR_DEPARTMENT_OTHER): Payer: Self-pay

## 2022-07-03 DIAGNOSIS — G4486 Cervicogenic headache: Secondary | ICD-10-CM | POA: Diagnosis not present

## 2022-07-03 DIAGNOSIS — M9906 Segmental and somatic dysfunction of lower extremity: Secondary | ICD-10-CM | POA: Diagnosis not present

## 2022-07-03 DIAGNOSIS — M9905 Segmental and somatic dysfunction of pelvic region: Secondary | ICD-10-CM | POA: Diagnosis not present

## 2022-07-03 DIAGNOSIS — M9902 Segmental and somatic dysfunction of thoracic region: Secondary | ICD-10-CM | POA: Diagnosis not present

## 2022-07-03 DIAGNOSIS — M9901 Segmental and somatic dysfunction of cervical region: Secondary | ICD-10-CM | POA: Diagnosis not present

## 2022-07-03 DIAGNOSIS — M9903 Segmental and somatic dysfunction of lumbar region: Secondary | ICD-10-CM | POA: Diagnosis not present

## 2022-07-03 NOTE — Telephone Encounter (Signed)
Left message on vm for patient to call back to schedule picking up orthotics.     Charges pending

## 2022-07-05 DIAGNOSIS — M9901 Segmental and somatic dysfunction of cervical region: Secondary | ICD-10-CM | POA: Diagnosis not present

## 2022-07-05 DIAGNOSIS — M9906 Segmental and somatic dysfunction of lower extremity: Secondary | ICD-10-CM | POA: Diagnosis not present

## 2022-07-05 DIAGNOSIS — G4486 Cervicogenic headache: Secondary | ICD-10-CM | POA: Diagnosis not present

## 2022-07-05 DIAGNOSIS — M9903 Segmental and somatic dysfunction of lumbar region: Secondary | ICD-10-CM | POA: Diagnosis not present

## 2022-07-05 DIAGNOSIS — M9905 Segmental and somatic dysfunction of pelvic region: Secondary | ICD-10-CM | POA: Diagnosis not present

## 2022-07-05 DIAGNOSIS — M9902 Segmental and somatic dysfunction of thoracic region: Secondary | ICD-10-CM | POA: Diagnosis not present

## 2022-07-07 DIAGNOSIS — R531 Weakness: Secondary | ICD-10-CM | POA: Diagnosis not present

## 2022-07-07 DIAGNOSIS — M94262 Chondromalacia, left knee: Secondary | ICD-10-CM | POA: Diagnosis not present

## 2022-07-10 NOTE — Progress Notes (Signed)
Patient presents today to pick up custom molded foot orthotics.  Orthotics were dispensed and fit was satisfactory. Reviewed instructions for break-in and wear. Written instructions given to patient.  Patient will follow up as needed.   

## 2022-07-10 NOTE — Progress Notes (Signed)
Patient presents today to be casted for custom molded orthotics. MCDONALD is the treating physician.  Impression foam cast was taken. ABN signed.  Patient will be notified once orthotics arrive in office and reappoint for fitting at that time.

## 2022-07-14 DIAGNOSIS — M9905 Segmental and somatic dysfunction of pelvic region: Secondary | ICD-10-CM | POA: Diagnosis not present

## 2022-07-14 DIAGNOSIS — M9906 Segmental and somatic dysfunction of lower extremity: Secondary | ICD-10-CM | POA: Diagnosis not present

## 2022-07-14 DIAGNOSIS — G4486 Cervicogenic headache: Secondary | ICD-10-CM | POA: Diagnosis not present

## 2022-07-14 DIAGNOSIS — M9901 Segmental and somatic dysfunction of cervical region: Secondary | ICD-10-CM | POA: Diagnosis not present

## 2022-07-14 DIAGNOSIS — M9902 Segmental and somatic dysfunction of thoracic region: Secondary | ICD-10-CM | POA: Diagnosis not present

## 2022-07-14 DIAGNOSIS — M9903 Segmental and somatic dysfunction of lumbar region: Secondary | ICD-10-CM | POA: Diagnosis not present

## 2022-07-17 DIAGNOSIS — M9905 Segmental and somatic dysfunction of pelvic region: Secondary | ICD-10-CM | POA: Diagnosis not present

## 2022-07-17 DIAGNOSIS — M9902 Segmental and somatic dysfunction of thoracic region: Secondary | ICD-10-CM | POA: Diagnosis not present

## 2022-07-17 DIAGNOSIS — M9903 Segmental and somatic dysfunction of lumbar region: Secondary | ICD-10-CM | POA: Diagnosis not present

## 2022-07-17 DIAGNOSIS — M9901 Segmental and somatic dysfunction of cervical region: Secondary | ICD-10-CM | POA: Diagnosis not present

## 2022-07-17 DIAGNOSIS — G4486 Cervicogenic headache: Secondary | ICD-10-CM | POA: Diagnosis not present

## 2022-07-17 DIAGNOSIS — M9906 Segmental and somatic dysfunction of lower extremity: Secondary | ICD-10-CM | POA: Diagnosis not present

## 2022-07-19 DIAGNOSIS — R531 Weakness: Secondary | ICD-10-CM | POA: Diagnosis not present

## 2022-07-19 DIAGNOSIS — M94262 Chondromalacia, left knee: Secondary | ICD-10-CM | POA: Diagnosis not present

## 2022-07-31 DIAGNOSIS — M9906 Segmental and somatic dysfunction of lower extremity: Secondary | ICD-10-CM | POA: Diagnosis not present

## 2022-07-31 DIAGNOSIS — M9903 Segmental and somatic dysfunction of lumbar region: Secondary | ICD-10-CM | POA: Diagnosis not present

## 2022-07-31 DIAGNOSIS — M9901 Segmental and somatic dysfunction of cervical region: Secondary | ICD-10-CM | POA: Diagnosis not present

## 2022-07-31 DIAGNOSIS — G4486 Cervicogenic headache: Secondary | ICD-10-CM | POA: Diagnosis not present

## 2022-07-31 DIAGNOSIS — M9902 Segmental and somatic dysfunction of thoracic region: Secondary | ICD-10-CM | POA: Diagnosis not present

## 2022-07-31 DIAGNOSIS — M9905 Segmental and somatic dysfunction of pelvic region: Secondary | ICD-10-CM | POA: Diagnosis not present

## 2022-08-04 DIAGNOSIS — M9905 Segmental and somatic dysfunction of pelvic region: Secondary | ICD-10-CM | POA: Diagnosis not present

## 2022-08-04 DIAGNOSIS — G4486 Cervicogenic headache: Secondary | ICD-10-CM | POA: Diagnosis not present

## 2022-08-04 DIAGNOSIS — M9902 Segmental and somatic dysfunction of thoracic region: Secondary | ICD-10-CM | POA: Diagnosis not present

## 2022-08-04 DIAGNOSIS — M9906 Segmental and somatic dysfunction of lower extremity: Secondary | ICD-10-CM | POA: Diagnosis not present

## 2022-08-04 DIAGNOSIS — M9903 Segmental and somatic dysfunction of lumbar region: Secondary | ICD-10-CM | POA: Diagnosis not present

## 2022-08-04 DIAGNOSIS — M9901 Segmental and somatic dysfunction of cervical region: Secondary | ICD-10-CM | POA: Diagnosis not present

## 2022-08-08 DIAGNOSIS — M94262 Chondromalacia, left knee: Secondary | ICD-10-CM | POA: Diagnosis not present

## 2022-08-08 DIAGNOSIS — R531 Weakness: Secondary | ICD-10-CM | POA: Diagnosis not present

## 2022-08-14 DIAGNOSIS — M9903 Segmental and somatic dysfunction of lumbar region: Secondary | ICD-10-CM | POA: Diagnosis not present

## 2022-08-14 DIAGNOSIS — M9901 Segmental and somatic dysfunction of cervical region: Secondary | ICD-10-CM | POA: Diagnosis not present

## 2022-08-14 DIAGNOSIS — G4486 Cervicogenic headache: Secondary | ICD-10-CM | POA: Diagnosis not present

## 2022-08-14 DIAGNOSIS — M9902 Segmental and somatic dysfunction of thoracic region: Secondary | ICD-10-CM | POA: Diagnosis not present

## 2022-08-14 DIAGNOSIS — M9906 Segmental and somatic dysfunction of lower extremity: Secondary | ICD-10-CM | POA: Diagnosis not present

## 2022-08-24 ENCOUNTER — Other Ambulatory Visit (HOSPITAL_BASED_OUTPATIENT_CLINIC_OR_DEPARTMENT_OTHER): Payer: Self-pay

## 2022-08-24 DIAGNOSIS — M9903 Segmental and somatic dysfunction of lumbar region: Secondary | ICD-10-CM | POA: Diagnosis not present

## 2022-08-24 DIAGNOSIS — M9902 Segmental and somatic dysfunction of thoracic region: Secondary | ICD-10-CM | POA: Diagnosis not present

## 2022-08-24 DIAGNOSIS — M9901 Segmental and somatic dysfunction of cervical region: Secondary | ICD-10-CM | POA: Diagnosis not present

## 2022-08-24 DIAGNOSIS — M9906 Segmental and somatic dysfunction of lower extremity: Secondary | ICD-10-CM | POA: Diagnosis not present

## 2022-08-24 DIAGNOSIS — G4486 Cervicogenic headache: Secondary | ICD-10-CM | POA: Diagnosis not present

## 2022-09-11 DIAGNOSIS — M9906 Segmental and somatic dysfunction of lower extremity: Secondary | ICD-10-CM | POA: Diagnosis not present

## 2022-09-11 DIAGNOSIS — M9902 Segmental and somatic dysfunction of thoracic region: Secondary | ICD-10-CM | POA: Diagnosis not present

## 2022-09-11 DIAGNOSIS — G4486 Cervicogenic headache: Secondary | ICD-10-CM | POA: Diagnosis not present

## 2022-09-11 DIAGNOSIS — M9903 Segmental and somatic dysfunction of lumbar region: Secondary | ICD-10-CM | POA: Diagnosis not present

## 2022-09-11 DIAGNOSIS — M9901 Segmental and somatic dysfunction of cervical region: Secondary | ICD-10-CM | POA: Diagnosis not present

## 2022-09-13 ENCOUNTER — Other Ambulatory Visit (HOSPITAL_BASED_OUTPATIENT_CLINIC_OR_DEPARTMENT_OTHER): Payer: Self-pay

## 2022-09-13 ENCOUNTER — Other Ambulatory Visit: Payer: Self-pay

## 2022-09-13 DIAGNOSIS — E569 Vitamin deficiency, unspecified: Secondary | ICD-10-CM | POA: Diagnosis not present

## 2022-09-13 DIAGNOSIS — G4700 Insomnia, unspecified: Secondary | ICD-10-CM | POA: Diagnosis not present

## 2022-09-13 DIAGNOSIS — N943 Premenstrual tension syndrome: Secondary | ICD-10-CM | POA: Diagnosis not present

## 2022-09-13 DIAGNOSIS — F419 Anxiety disorder, unspecified: Secondary | ICD-10-CM | POA: Diagnosis not present

## 2022-09-13 DIAGNOSIS — Z131 Encounter for screening for diabetes mellitus: Secondary | ICD-10-CM | POA: Diagnosis not present

## 2022-09-13 DIAGNOSIS — Z1329 Encounter for screening for other suspected endocrine disorder: Secondary | ICD-10-CM | POA: Diagnosis not present

## 2022-09-13 DIAGNOSIS — M2550 Pain in unspecified joint: Secondary | ICD-10-CM | POA: Diagnosis not present

## 2022-09-13 MED ORDER — NYSTATIN 500000 UNITS PO TABS
1.0000 | ORAL_TABLET | Freq: Two times a day (BID) | ORAL | 1 refills | Status: DC
  Filled 2022-09-13: qty 42, 21d supply, fill #0
  Filled 2022-10-17: qty 42, 21d supply, fill #1

## 2022-09-14 ENCOUNTER — Other Ambulatory Visit (HOSPITAL_BASED_OUTPATIENT_CLINIC_OR_DEPARTMENT_OTHER): Payer: Self-pay

## 2022-09-15 ENCOUNTER — Other Ambulatory Visit: Payer: Self-pay

## 2022-09-15 ENCOUNTER — Other Ambulatory Visit (HOSPITAL_COMMUNITY): Payer: Self-pay

## 2022-09-21 ENCOUNTER — Other Ambulatory Visit (HOSPITAL_BASED_OUTPATIENT_CLINIC_OR_DEPARTMENT_OTHER): Payer: Self-pay

## 2022-09-21 DIAGNOSIS — M9902 Segmental and somatic dysfunction of thoracic region: Secondary | ICD-10-CM | POA: Diagnosis not present

## 2022-09-21 DIAGNOSIS — G4486 Cervicogenic headache: Secondary | ICD-10-CM | POA: Diagnosis not present

## 2022-09-21 DIAGNOSIS — M9906 Segmental and somatic dysfunction of lower extremity: Secondary | ICD-10-CM | POA: Diagnosis not present

## 2022-09-21 DIAGNOSIS — M9903 Segmental and somatic dysfunction of lumbar region: Secondary | ICD-10-CM | POA: Diagnosis not present

## 2022-09-21 DIAGNOSIS — M9901 Segmental and somatic dysfunction of cervical region: Secondary | ICD-10-CM | POA: Diagnosis not present

## 2022-09-26 ENCOUNTER — Ambulatory Visit: Payer: BC Managed Care – PPO | Admitting: Family Medicine

## 2022-10-04 DIAGNOSIS — N951 Menopausal and female climacteric states: Secondary | ICD-10-CM | POA: Diagnosis not present

## 2022-10-05 ENCOUNTER — Other Ambulatory Visit (HOSPITAL_BASED_OUTPATIENT_CLINIC_OR_DEPARTMENT_OTHER): Payer: Self-pay

## 2022-10-09 DIAGNOSIS — M9902 Segmental and somatic dysfunction of thoracic region: Secondary | ICD-10-CM | POA: Diagnosis not present

## 2022-10-09 DIAGNOSIS — M9901 Segmental and somatic dysfunction of cervical region: Secondary | ICD-10-CM | POA: Diagnosis not present

## 2022-10-09 DIAGNOSIS — M9903 Segmental and somatic dysfunction of lumbar region: Secondary | ICD-10-CM | POA: Diagnosis not present

## 2022-10-09 DIAGNOSIS — M9906 Segmental and somatic dysfunction of lower extremity: Secondary | ICD-10-CM | POA: Diagnosis not present

## 2022-10-09 DIAGNOSIS — G4486 Cervicogenic headache: Secondary | ICD-10-CM | POA: Diagnosis not present

## 2022-10-17 ENCOUNTER — Other Ambulatory Visit: Payer: Self-pay

## 2022-10-17 ENCOUNTER — Other Ambulatory Visit (HOSPITAL_BASED_OUTPATIENT_CLINIC_OR_DEPARTMENT_OTHER): Payer: Self-pay

## 2022-10-19 ENCOUNTER — Other Ambulatory Visit (HOSPITAL_BASED_OUTPATIENT_CLINIC_OR_DEPARTMENT_OTHER): Payer: Self-pay

## 2022-10-19 DIAGNOSIS — M2550 Pain in unspecified joint: Secondary | ICD-10-CM | POA: Diagnosis not present

## 2022-10-19 DIAGNOSIS — G4700 Insomnia, unspecified: Secondary | ICD-10-CM | POA: Diagnosis not present

## 2022-10-19 DIAGNOSIS — N943 Premenstrual tension syndrome: Secondary | ICD-10-CM | POA: Diagnosis not present

## 2022-10-19 DIAGNOSIS — F419 Anxiety disorder, unspecified: Secondary | ICD-10-CM | POA: Diagnosis not present

## 2022-10-19 MED ORDER — VALACYCLOVIR HCL 1 G PO TABS
1000.0000 mg | ORAL_TABLET | Freq: Two times a day (BID) | ORAL | 1 refills | Status: DC
  Filled 2022-10-19: qty 60, 30d supply, fill #0
  Filled 2023-03-16: qty 60, 30d supply, fill #1

## 2022-12-06 ENCOUNTER — Ambulatory Visit (INDEPENDENT_AMBULATORY_CARE_PROVIDER_SITE_OTHER): Payer: BC Managed Care – PPO | Admitting: Family Medicine

## 2022-12-06 ENCOUNTER — Encounter: Payer: Self-pay | Admitting: Family Medicine

## 2022-12-06 ENCOUNTER — Other Ambulatory Visit: Payer: Self-pay

## 2022-12-06 ENCOUNTER — Other Ambulatory Visit (HOSPITAL_BASED_OUTPATIENT_CLINIC_OR_DEPARTMENT_OTHER): Payer: Self-pay

## 2022-12-06 VITALS — BP 116/74 | HR 64 | Resp 18 | Ht 65.0 in | Wt 180.2 lb

## 2022-12-06 DIAGNOSIS — Z6830 Body mass index (BMI) 30.0-30.9, adult: Secondary | ICD-10-CM

## 2022-12-06 DIAGNOSIS — F411 Generalized anxiety disorder: Secondary | ICD-10-CM

## 2022-12-06 DIAGNOSIS — E6609 Other obesity due to excess calories: Secondary | ICD-10-CM | POA: Diagnosis not present

## 2022-12-06 DIAGNOSIS — M1712 Unilateral primary osteoarthritis, left knee: Secondary | ICD-10-CM | POA: Diagnosis not present

## 2022-12-06 DIAGNOSIS — C44612 Basal cell carcinoma of skin of right upper limb, including shoulder: Secondary | ICD-10-CM | POA: Insufficient documentation

## 2022-12-06 MED ORDER — ZEPBOUND 2.5 MG/0.5ML ~~LOC~~ SOAJ
2.5000 mg | SUBCUTANEOUS | 0 refills | Status: DC
  Filled 2022-12-06: qty 2, 28d supply, fill #0

## 2022-12-06 NOTE — Assessment & Plan Note (Signed)
Very pleasant 47 year old female presents with concerns of recent weight gain in the past year.  She did have blood work recently drawn and hormone levels were within normal range as well as thyroid and A1c.  She has done 7 months of diligent diet and exercise with failed weight loss.  Based on her diet and exercise modification as well as comorbidities of osteoarthritis and anxiety in addition to her history of elevated BMI of 30, patient would be an excellent candidate for Zepbound.  We did discuss this medication and how to take it.  We will go ahead and order Zepbound and have patient follow-up

## 2022-12-06 NOTE — Progress Notes (Signed)
New patient visit   Patient: Lydia Torres   DOB: 1975/11/04   47 y.o. Female  MRN: [id number] Visit Date: 12/06/2022  Today's healthcare provider: Charlton Amor, DO   Chief Complaint  Patient presents with   New Patient (Initial Visit)    Establish care    SUBJECTIVE    Chief Complaint  Patient presents with   New Patient (Initial Visit)    Establish care   HPI HPI     New Patient (Initial Visit)    Additional comments: Establish care      Last edited by Roselyn Reef, CMA on 12/06/2022 10:22 AM.      Pt presents to establish care.  She has concerns today of weight gain.  She has within the last year she has gained about 20 pounds.  She has been very diligent doing diet and exercise for 7 months now.  Her diet consists of 2 eggs in the morning with avocado, a can of tuna and fruit at lunch, and then a small meal at dinner.  If she makes Post[place] for her kids she will do a substitute for herself.  She does have a past medical history of osteoarthritis in the left knee that has been inhibiting her ability to vigorously workout however she has been diligent with working out multiple days per week.  She also has a history of anxiety.  She is doing well on Lexapro 2.5.  She did go to [facility] and had blood work drawn.  Blood work was unremarkable.  She is very worried because her mom was very similar to this.  She was in normal weight and then in her 40s started to gain weight and now has diabetes.  Patient is very worried about developing diabetes.  Review of Systems  Constitutional:  Negative for activity change, fatigue and fever.  Respiratory:  Negative for cough and shortness of breath.   Cardiovascular:  Negative for chest pain.  Gastrointestinal:  Negative for abdominal pain.  Genitourinary:  Negative for difficulty urinating.       Current Meds  Medication Sig   b complex vitamins capsule Take 1 capsule by mouth daily.   Cholecalciferol  (VITAMIN D3) 250 MCG (10000 UT) TABS Take by mouth.   escitalopram (LEXAPRO) 5 MG tablet Take 1 tablet (5 mg total) by mouth daily.   EUFLEXXA 20 MG/2ML SOSY SMARTSIG:I-ARTIC   levonorgestrel (MIRENA) 20 MCG/24HR IUD 1 each by Intrauterine route once. Reported on 05/12/2015   magnesium gluconate (MAGONATE) 500 MG tablet Take 500 mg by mouth 2 (two) times daily.   nystatin (MYCOSTATIN) 500000 units TABS tablet Take 1 tablet (500,000 Units total) by mouth 2 (two) times daily.   Omega-3 Fatty Acids (FISH OIL) 1000 MG CAPS Take 1,000 mg by mouth daily.   tirzepatide (ZEPBOUND) 2.5 MG/0.5ML Pen Inject 2.5 mg into the skin once a week.   valACYclovir (VALTREX) 1000 MG tablet Take 1 tablet (1,000 mg total) by mouth 2 (two) times daily.    OBJECTIVE    BP 116/74 (BP Location: Left Arm, Patient Position: Sitting, Cuff Size: Normal)   Pulse 64   Resp 18   Ht 5\' 5"  (1.651 m)   Wt 180 lb 4 oz (81.8 kg)   SpO2 100%   BMI 30.00 kg/m   Physical Exam Vitals and nursing note reviewed.  Constitutional:      General: She is not in acute distress.    Appearance: Normal appearance.  HENT:     Head: Normocephalic and atraumatic.     Right Ear: External ear normal.     Left Ear: External ear normal.     Nose: Nose normal.  Eyes:     Conjunctiva/sclera: Conjunctivae normal.  Cardiovascular:     Rate and Rhythm: Normal rate and regular rhythm.  Pulmonary:     Effort: Pulmonary effort is normal.     Breath sounds: Normal breath sounds.  Neurological:     General: No focal deficit present.     Mental Status: She is alert and oriented to person, place, and time.  Psychiatric:        Mood and Affect: Mood normal.        Behavior: Behavior normal.        Thought Content: Thought content normal.        Judgment: Judgment normal.        ASSESSMENT & PLAN    Problem List Items Addressed This Visit       Musculoskeletal and Integument   Primary osteoarthritis of left knee   Relevant  Medications   tirzepatide (ZEPBOUND) 2.5 MG/0.5ML Pen     Other   Class 1 obesity due to excess calories with serious comorbidity and body mass index (BMI) of 30.0 to 30.9 in adult - Primary    Very pleasant 47 year old female presents with concerns of recent weight gain in the past year.  She did have blood work recently drawn and hormone levels were within normal range as well as thyroid and A1c.  She has done 7 months of diligent diet and exercise with failed weight loss.  Based on her diet and exercise modification as well as comorbidities of osteoarthritis and anxiety in addition to her history of elevated BMI of 30, patient would be an excellent candidate for Zepbound.  We did discuss this medication and how to take it.  We will go ahead and order Zepbound and have patient follow-up      Relevant Medications   tirzepatide (ZEPBOUND) 2.5 MG/0.5ML Pen   GAD (generalized anxiety disorder)    Well-controlled on Lexapro at this time we will continue.  Patient says she does not need any refills at this time however I told her when it is due for refill to let me know she is currently on Lexapro 2.5 mg daily      Relevant Medications   tirzepatide (ZEPBOUND) 2.5 MG/0.5ML Pen    Return in about 3 months (around 03/08/2023).      Meds ordered this encounter  Medications   tirzepatide (ZEPBOUND) 2.5 MG/0.5ML Pen    Sig: Inject 2.5 mg into the skin once a week.    Dispense:  2 mL    Refill:  0    No orders of the defined types were placed in this encounter.    Charlton Amor, DO  [facility] at [facility] [phone] (phone) [phone] (fax)  [facility]

## 2022-12-06 NOTE — Assessment & Plan Note (Signed)
Well-controlled on Lexapro at this time we will continue.  Patient says she does not need any refills at this time however I told her when it is due for refill to let me know she is currently on Lexapro 2.5 mg daily

## 2022-12-08 ENCOUNTER — Other Ambulatory Visit (HOSPITAL_BASED_OUTPATIENT_CLINIC_OR_DEPARTMENT_OTHER): Payer: Self-pay

## 2022-12-08 ENCOUNTER — Encounter: Payer: Self-pay | Admitting: Family Medicine

## 2022-12-08 ENCOUNTER — Telehealth: Payer: Self-pay

## 2022-12-08 NOTE — Telephone Encounter (Signed)
Initiated Prior authorization [id number] 2.5MG /0.5ML pen-injectors Via: Covermymeds Case/Key:[id number] Status: approved  as of 12/08/22 Reason:Authorization Expiration Date: 08/05/2023  Notified Pt via: Mychart

## 2022-12-15 DIAGNOSIS — M9902 Segmental and somatic dysfunction of thoracic region: Secondary | ICD-10-CM | POA: Diagnosis not present

## 2022-12-15 DIAGNOSIS — M9906 Segmental and somatic dysfunction of lower extremity: Secondary | ICD-10-CM | POA: Diagnosis not present

## 2022-12-15 DIAGNOSIS — G4486 Cervicogenic headache: Secondary | ICD-10-CM | POA: Diagnosis not present

## 2022-12-15 DIAGNOSIS — M9901 Segmental and somatic dysfunction of cervical region: Secondary | ICD-10-CM | POA: Diagnosis not present

## 2022-12-15 DIAGNOSIS — M9903 Segmental and somatic dysfunction of lumbar region: Secondary | ICD-10-CM | POA: Diagnosis not present

## 2022-12-21 ENCOUNTER — Other Ambulatory Visit: Payer: Self-pay | Admitting: Family Medicine

## 2022-12-21 DIAGNOSIS — Z6830 Body mass index (BMI) 30.0-30.9, adult: Secondary | ICD-10-CM

## 2022-12-21 MED ORDER — ZEPBOUND 7.5 MG/0.5ML ~~LOC~~ SOAJ: 7.5000 mg | SUBCUTANEOUS | 0 refills | Status: DC

## 2022-12-21 MED ORDER — ZEPBOUND 5 MG/0.5ML ~~LOC~~ SOAJ
5.0000 mg | SUBCUTANEOUS | 0 refills | Status: DC
  Filled 2022-12-27: qty 2, 28d supply, fill #0

## 2022-12-27 ENCOUNTER — Other Ambulatory Visit (HOSPITAL_BASED_OUTPATIENT_CLINIC_OR_DEPARTMENT_OTHER): Payer: Self-pay

## 2023-01-03 ENCOUNTER — Other Ambulatory Visit (HOSPITAL_BASED_OUTPATIENT_CLINIC_OR_DEPARTMENT_OTHER): Payer: Self-pay

## 2023-01-03 MED ORDER — NYSTATIN 500000 UNITS PO TABS
1.0000 | ORAL_TABLET | Freq: Two times a day (BID) | ORAL | 1 refills | Status: DC
Start: 2023-01-03 — End: 2024-02-22
  Filled 2023-01-03: qty 42, 21d supply, fill #0

## 2023-01-04 ENCOUNTER — Other Ambulatory Visit (HOSPITAL_BASED_OUTPATIENT_CLINIC_OR_DEPARTMENT_OTHER): Payer: Self-pay

## 2023-01-04 DIAGNOSIS — Z411 Encounter for cosmetic surgery: Secondary | ICD-10-CM | POA: Diagnosis not present

## 2023-01-04 DIAGNOSIS — L821 Other seborrheic keratosis: Secondary | ICD-10-CM | POA: Diagnosis not present

## 2023-01-04 DIAGNOSIS — D1801 Hemangioma of skin and subcutaneous tissue: Secondary | ICD-10-CM | POA: Diagnosis not present

## 2023-01-04 DIAGNOSIS — B078 Other viral warts: Secondary | ICD-10-CM | POA: Diagnosis not present

## 2023-01-04 DIAGNOSIS — L814 Other melanin hyperpigmentation: Secondary | ICD-10-CM | POA: Diagnosis not present

## 2023-01-04 MED ORDER — TRETINOIN 0.05 % EX CREA
TOPICAL_CREAM | Freq: Every evening | CUTANEOUS | 2 refills | Status: DC
  Filled 2023-01-04: qty 45, 90d supply, fill #0

## 2023-01-05 ENCOUNTER — Other Ambulatory Visit (HOSPITAL_BASED_OUTPATIENT_CLINIC_OR_DEPARTMENT_OTHER): Payer: Self-pay

## 2023-01-11 DIAGNOSIS — M2550 Pain in unspecified joint: Secondary | ICD-10-CM | POA: Diagnosis not present

## 2023-01-11 DIAGNOSIS — N943 Premenstrual tension syndrome: Secondary | ICD-10-CM | POA: Diagnosis not present

## 2023-01-11 DIAGNOSIS — E559 Vitamin D deficiency, unspecified: Secondary | ICD-10-CM | POA: Diagnosis not present

## 2023-01-15 DIAGNOSIS — M9901 Segmental and somatic dysfunction of cervical region: Secondary | ICD-10-CM | POA: Diagnosis not present

## 2023-01-15 DIAGNOSIS — M9903 Segmental and somatic dysfunction of lumbar region: Secondary | ICD-10-CM | POA: Diagnosis not present

## 2023-01-15 DIAGNOSIS — M9902 Segmental and somatic dysfunction of thoracic region: Secondary | ICD-10-CM | POA: Diagnosis not present

## 2023-01-15 DIAGNOSIS — M9906 Segmental and somatic dysfunction of lower extremity: Secondary | ICD-10-CM | POA: Diagnosis not present

## 2023-01-15 DIAGNOSIS — G4486 Cervicogenic headache: Secondary | ICD-10-CM | POA: Diagnosis not present

## 2023-01-21 ENCOUNTER — Other Ambulatory Visit: Payer: Self-pay | Admitting: Family Medicine

## 2023-01-21 ENCOUNTER — Encounter (INDEPENDENT_AMBULATORY_CARE_PROVIDER_SITE_OTHER): Payer: BC Managed Care – PPO | Admitting: Family Medicine

## 2023-01-21 DIAGNOSIS — K2960 Other gastritis without bleeding: Secondary | ICD-10-CM

## 2023-01-21 DIAGNOSIS — Z6830 Body mass index (BMI) 30.0-30.9, adult: Secondary | ICD-10-CM

## 2023-01-21 DIAGNOSIS — E6609 Other obesity due to excess calories: Secondary | ICD-10-CM

## 2023-01-22 MED ORDER — ZEPBOUND 5 MG/0.5ML ~~LOC~~ SOAJ
5.0000 mg | SUBCUTANEOUS | 3 refills | Status: DC
Start: 2023-01-22 — End: 2023-03-12
  Filled 2023-01-26: qty 2, 28d supply, fill #0

## 2023-01-22 MED ORDER — OMEPRAZOLE 20 MG PO CPDR
20.0000 mg | DELAYED_RELEASE_CAPSULE | Freq: Every day | ORAL | 4 refills | Status: AC
Start: 2023-01-22 — End: ?

## 2023-01-22 NOTE — Telephone Encounter (Signed)
Please see the MyChart message reply(ies) for my assessment and plan.    This patient gave consent for this Medical Advice Message and is aware that it may result in a bill to [employer], as well as the possibility of receiving a bill for a co-payment or deductible. They are an established patient, but are not seeking medical advice exclusively about a problem treated during an in person or video visit in the last seven days. I did not recommend an in person or video visit within seven days of my reply.    I spent a total of 11 minutes cumulative time within 7 days through [employer].  Stay at 5mg  zepbound  Charlton Amor, DO

## 2023-01-25 DIAGNOSIS — N943 Premenstrual tension syndrome: Secondary | ICD-10-CM | POA: Diagnosis not present

## 2023-01-25 DIAGNOSIS — M2550 Pain in unspecified joint: Secondary | ICD-10-CM | POA: Diagnosis not present

## 2023-01-25 DIAGNOSIS — F419 Anxiety disorder, unspecified: Secondary | ICD-10-CM | POA: Diagnosis not present

## 2023-01-25 DIAGNOSIS — G4700 Insomnia, unspecified: Secondary | ICD-10-CM | POA: Diagnosis not present

## 2023-01-26 ENCOUNTER — Other Ambulatory Visit (HOSPITAL_BASED_OUTPATIENT_CLINIC_OR_DEPARTMENT_OTHER): Payer: Self-pay

## 2023-01-26 ENCOUNTER — Encounter: Payer: Self-pay | Admitting: Family Medicine

## 2023-01-26 ENCOUNTER — Other Ambulatory Visit: Payer: Self-pay | Admitting: Family Medicine

## 2023-01-26 DIAGNOSIS — E6609 Other obesity due to excess calories: Secondary | ICD-10-CM

## 2023-01-29 ENCOUNTER — Encounter: Payer: Self-pay | Admitting: Family Medicine

## 2023-01-29 ENCOUNTER — Other Ambulatory Visit: Payer: Self-pay

## 2023-01-29 ENCOUNTER — Ambulatory Visit: Payer: BC Managed Care – PPO

## 2023-01-29 ENCOUNTER — Other Ambulatory Visit (HOSPITAL_BASED_OUTPATIENT_CLINIC_OR_DEPARTMENT_OTHER): Payer: Self-pay

## 2023-01-29 ENCOUNTER — Ambulatory Visit (INDEPENDENT_AMBULATORY_CARE_PROVIDER_SITE_OTHER): Payer: BC Managed Care – PPO | Admitting: Family Medicine

## 2023-01-29 VITALS — BP 113/79 | HR 63 | Ht 65.0 in | Wt 168.2 lb

## 2023-01-29 DIAGNOSIS — R591 Generalized enlarged lymph nodes: Secondary | ICD-10-CM | POA: Insufficient documentation

## 2023-01-29 DIAGNOSIS — M542 Cervicalgia: Secondary | ICD-10-CM | POA: Diagnosis not present

## 2023-01-29 DIAGNOSIS — M47816 Spondylosis without myelopathy or radiculopathy, lumbar region: Secondary | ICD-10-CM | POA: Diagnosis not present

## 2023-01-29 DIAGNOSIS — G8929 Other chronic pain: Secondary | ICD-10-CM

## 2023-01-29 DIAGNOSIS — M2550 Pain in unspecified joint: Secondary | ICD-10-CM | POA: Insufficient documentation

## 2023-01-29 DIAGNOSIS — M545 Low back pain, unspecified: Secondary | ICD-10-CM

## 2023-01-29 MED ORDER — METHYLPREDNISOLONE 4 MG PO TBPK
ORAL_TABLET | ORAL | 0 refills | Status: DC
  Filled 2023-01-29: qty 21, 6d supply, fill #0

## 2023-01-29 NOTE — Assessment & Plan Note (Addendum)
Pt has joint aches. Will go ahead and order blood work for further eval. Unclear etiology based on physical exam. Almost sounds like PMR with hip and shoulder girdle pain with pain first thing in the morning - Differential: polyarthritis, fibromyalgia, osteoarthritis, hypothyroidism, RA, carpal tunnel

## 2023-01-29 NOTE — Assessment & Plan Note (Signed)
Pt having some lymphadenopathy in cervical region. No supraclavicular lymphadenopathy  - could be from this inflammatory response she is having but if it persists for two more weeks we will go ahead and order CT head/neck for further investigation. Weight loss is difficult to assess at this time since patient is on zepbound and purposely trying to lose weight. - pt sexually active with one partner says no concerns for hiv or syphillis

## 2023-01-29 NOTE — Progress Notes (Signed)
Established patient visit   Patient: Lydia Torres   DOB: [birth year]   47 y.o. Female  MRN: [id number] Visit Date: 01/29/2023  Today's healthcare provider: Charlton Amor, DO   Chief Complaint  Patient presents with   Temporomandibular Joint Pain    SUBJECTIVE    Chief Complaint  Patient presents with   Temporomandibular Joint Pain   HPI  Pt here today to discuss joint pain. In the past she has thought her joint pain was related to her weight. However, since being on zepbound for weight loss and losing an appropriate amount of weight she thought the pain would be relieved. This is not the case. She notes pain in the mornings specifically in her hands that have to be "worked out" until they become less stiff. 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 Does have a diagnosis of osteoarthritis in her left knee.  Says she feels like she is hot and her joints are on fire. Also notes pain in her hips, shoulders, and neck.   Review of Systems  Constitutional:  Negative for activity change, fatigue and fever.  Respiratory:  Negative for cough and shortness of breath.   Cardiovascular:  Negative for chest pain.  Gastrointestinal:  Negative for abdominal pain.  Genitourinary:  Negative for difficulty urinating.  Musculoskeletal:  Positive for arthralgias.       Current Meds  Medication Sig   b complex vitamins capsule Take 1 capsule by mouth daily.   Cholecalciferol (VITAMIN D3) 250 MCG (10000 UT) TABS Take by mouth.   escitalopram (LEXAPRO) 5 MG tablet Take 1 tablet (5 mg total) by mouth daily.   EUFLEXXA 20 MG/2ML SOSY SMARTSIG:I-ARTIC   levonorgestrel (MIRENA) 20 MCG/24HR IUD 1 each by Intrauterine route once. Reported on 05/12/2015   magnesium gluconate (MAGONATE) 500 MG tablet Take 500 mg by mouth 2 (two) times daily.   methylPREDNISolone (MEDROL DOSEPAK) 4 MG TBPK tablet Follow instructions per pill pack   nystatin (MYCOSTATIN) 500000 units TABS tablet  Take 1 tablet (500,000 Units total) by mouth 2 (two) times daily.   Omega-3 Fatty Acids (FISH OIL) 1000 MG CAPS Take 1,000 mg by mouth daily.   omeprazole (PRILOSEC) 20 MG capsule Take 1 capsule (20 mg total) by mouth daily.   tirzepatide (ZEPBOUND) 5 MG/0.5ML Pen Inject 5 mg into the skin once a week.   tirzepatide (ZEPBOUND) 7.5 MG/0.5ML Pen Inject 7.5 mg into the skin once a week.   tretinoin (RETIN-A) 0.05 % cream Apply a pea-sized amount to face once at night   valACYclovir (VALTREX) 1000 MG tablet Take 1 tablet (1,000 mg total) by mouth 2 (two) times daily.    OBJECTIVE    BP 113/79   Pulse 63   Ht 5\' 5"  (1.651 m)   Wt 168 lb 4 oz (76.3 kg)   SpO2 100%   BMI 28.00 kg/m   Physical Exam Vitals and nursing note reviewed.  Constitutional:      General: She is not in acute distress.    Appearance: Normal appearance.  HENT:     Head: Normocephalic and atraumatic.     Right Ear: External ear normal.     Left Ear: External ear normal.     Nose: Nose normal.  Eyes:     Conjunctiva/sclera: Conjunctivae normal.  Cardiovascular:     Rate and Rhythm: Normal rate and regular rhythm.  Pulmonary:     Effort: Pulmonary effort is  normal.     Breath sounds: Normal breath sounds.  Neurological:     General: No focal deficit present.     Mental Status: She is alert and oriented to person, place, and time.  Psychiatric:        Mood and Affect: Mood normal.        Behavior: Behavior normal.        Thought Content: Thought content normal.        Judgment: Judgment normal.        ASSESSMENT & PLAN    Problem List Items Addressed This Visit       Immune and Lymphatic   Lymphadenopathy    Pt having some lymphadenopathy in cervical region. No supraclavicular lymphadenopathy  - could be from this inflammatory response she is having but if it persists for two more weeks we will go ahead and order CT head/neck for further investigation. Weight loss is difficult to assess at this time  since patient is on zepbound and purposely trying to lose weight. - pt sexually active with one partner says no concerns for hiv or syphillis        Other   Polyarthralgia - Primary    Pt has joint aches. Will go ahead and order blood work for further eval. Unclear etiology based on physical exam. Almost sounds like RA with pain first thing in the morning - Differential: polyarthritis, fibromyalgia, osteoarthritis, hypothyroidism, RA, carpal tunnel       Relevant Orders   ANA,IFA RA Diag Pnl w/rflx Tit/Patn   Sed Rate (ESR)   C-reactive protein   TSH + free T4   B. burgdorfi antibodies   CBC w/Diff/Platelet   CMP14+EGFR   Lyme Disease Serology w/Reflex   Chronic bilateral low back pain without sciatica    - will go ahead and obtain xrays since patient has had adequate physical therapy in the past with no improvement       Relevant Medications   methylPREDNISolone (MEDROL DOSEPAK) 4 MG TBPK tablet   Other Relevant Orders   DG Cervical Spine Complete   DG Lumbar Spine 2-3 Views   Neck pain    Will go ahead and order c spine xray to further look into neck pain      Relevant Orders   DG Cervical Spine Complete   DG Lumbar Spine 2-3 Views    No follow-ups on file.      Meds ordered this encounter  Medications   methylPREDNISolone (MEDROL DOSEPAK) 4 MG TBPK tablet    Sig: Follow instructions per pill pack    Dispense:  21 tablet    Refill:  0    Orders Placed This Encounter  Procedures   DG Cervical Spine Complete    Standing Status:   Future    Standing Expiration Date:   01/29/2024    Order Specific Question:   Preferred imaging location?    Answer:   [facility]    Comments:   Please include AP, lateral, obliques, and odontoid views.    Order Specific Question:   Reason for exam:    Answer:   Please include AP, lateral, obliques, and odontoid views.    Comments:   Please include AP, lateral, obliques, and odontoid views.    Order Specific Question:    Release to patient    Answer:   Immediate   DG Lumbar Spine 2-3 Views    Standing Status:   Future    Standing Expiration Date:  07/29/2023    Order Specific Question:   Reason for Exam (SYMPTOM  OR DIAGNOSIS REQUIRED)    Answer:   low back pain, chronic    Order Specific Question:   Is the patient pregnant?    Answer:   No    Order Specific Question:   Preferred imaging location?    Answer:   [facility]    Comments:   Please include AP, lateral, obliques, and odontoid views.   ANA,IFA RA Diag Pnl w/rflx Tit/Patn   Sed Rate (ESR)   C-reactive protein   TSH + free T4   B. burgdorfi antibodies   CBC w/Diff/Platelet   CMP14+EGFR   Lyme Disease Serology w/Reflex     Charlton Amor, DO  [facility] at [facility] [phone] (phone) [phone] (fax)  [facility]

## 2023-01-29 NOTE — Assessment & Plan Note (Signed)
Will go ahead and order c spine xray to further look into neck pain

## 2023-01-29 NOTE — Assessment & Plan Note (Signed)
-   will go ahead and obtain xrays since patient has had adequate physical therapy in the past with no improvement

## 2023-01-30 LAB — LYME DISEASE SEROLOGY W/REFLEX: Lyme Total Antibody EIA: NEGATIVE

## 2023-01-31 ENCOUNTER — Other Ambulatory Visit: Payer: Self-pay | Admitting: Family Medicine

## 2023-01-31 ENCOUNTER — Encounter: Payer: Self-pay | Admitting: Family Medicine

## 2023-01-31 DIAGNOSIS — R591 Generalized enlarged lymph nodes: Secondary | ICD-10-CM

## 2023-01-31 LAB — CMP14+EGFR
ALT: 15 [IU]/L (ref 0–32)
AST: 16 [IU]/L (ref 0–40)
Albumin: 4.3 g/dL (ref 3.9–4.9)
Alkaline Phosphatase: 47 [IU]/L (ref 44–121)
BUN/Creatinine Ratio: 13 (ref 9–23)
BUN: 9 mg/dL (ref 6–24)
Bilirubin Total: 0.3 mg/dL (ref 0.0–1.2)
CO2: 23 mmol/L (ref 20–29)
Calcium: 9.5 mg/dL (ref 8.7–10.2)
Chloride: 104 mmol/L (ref 96–106)
Creatinine, Ser: 0.7 mg/dL (ref 0.57–1.00)
Globulin, Total: 2.2 g/dL (ref 1.5–4.5)
Glucose: 91 mg/dL (ref 70–99)
Potassium: 3.8 mmol/L (ref 3.5–5.2)
Sodium: 141 mmol/L (ref 134–144)
Total Protein: 6.5 g/dL (ref 6.0–8.5)
eGFR: 108 mL/min/{1.73_m2} (ref >59)

## 2023-01-31 LAB — CBC WITH DIFFERENTIAL/PLATELET
Basophils Absolute: 0 10*3/uL (ref 0.0–0.2)
Basos: 1 %
EOS (ABSOLUTE): 0 10*3/uL (ref 0.0–0.4)
Eos: 1 %
Hematocrit: 41.4 % (ref 34.0–46.6)
Hemoglobin: 14.2 g/dL (ref 11.1–15.9)
Immature Grans (Abs): 0 10*3/uL (ref 0.0–0.1)
Immature Granulocytes: 0 %
Lymphocytes Absolute: 2.7 10*3/uL (ref 0.7–3.1)
Lymphs: 48 %
MCH: 31.6 pg (ref 26.6–33.0)
MCHC: 34.3 g/dL (ref 31.5–35.7)
MCV: 92 fL (ref 79–97)
Monocytes Absolute: 0.5 10*3/uL (ref 0.1–0.9)
Monocytes: 8 %
Neutrophils Absolute: 2.3 10*3/uL (ref 1.4–7.0)
Neutrophils: 42 %
Platelets: 348 10*3/uL (ref 150–450)
RBC: 4.5 x10E6/uL (ref 3.77–5.28)
RDW: 12.3 % (ref 11.7–15.4)
WBC: 5.6 10*3/uL (ref 3.4–10.8)

## 2023-01-31 LAB — ANA,IFA RA DIAG PNL W/RFLX TIT/PATN
Cyclic Citrullin Peptide Ab: 9 U (ref 0–19)
Rheumatoid fact SerPl-aCnc: <10 [IU]/mL (ref <14.0)

## 2023-01-31 LAB — TSH+FREE T4
Free T4: 1.39 ng/dL (ref 0.82–1.77)
TSH: 1.89 u[IU]/mL (ref 0.450–4.500)

## 2023-01-31 LAB — C-REACTIVE PROTEIN: CRP: <1 mg/L (ref 0–10)

## 2023-01-31 LAB — SEDIMENTATION RATE: Sed Rate: 5 mm/h (ref 0–32)

## 2023-02-01 ENCOUNTER — Other Ambulatory Visit: Payer: Self-pay | Admitting: Family Medicine

## 2023-02-01 ENCOUNTER — Other Ambulatory Visit (HOSPITAL_BASED_OUTPATIENT_CLINIC_OR_DEPARTMENT_OTHER): Payer: Self-pay

## 2023-02-01 DIAGNOSIS — M62838 Other muscle spasm: Secondary | ICD-10-CM

## 2023-02-01 DIAGNOSIS — M792 Neuralgia and neuritis, unspecified: Secondary | ICD-10-CM

## 2023-02-01 MED ORDER — GABAPENTIN 100 MG PO CAPS
100.0000 mg | ORAL_CAPSULE | Freq: Three times a day (TID) | ORAL | 0 refills | Status: DC
Start: 2023-02-01 — End: 2023-04-18
  Filled 2023-02-01 – 2023-02-02 (×2): qty 30, 10d supply, fill #0

## 2023-02-01 MED ORDER — VALACYCLOVIR HCL 1 G PO TABS
1000.0000 mg | ORAL_TABLET | Freq: Three times a day (TID) | ORAL | 0 refills | Status: AC
  Filled 2023-02-01 – 2023-02-06 (×2): qty 21, 7d supply, fill #0

## 2023-02-01 MED ORDER — CYCLOBENZAPRINE HCL 10 MG PO TABS
10.0000 mg | ORAL_TABLET | Freq: Two times a day (BID) | ORAL | 0 refills | Status: DC | PRN
Start: 2023-02-01 — End: 2024-02-22
  Filled 2023-02-01 – 2023-02-02 (×2): qty 30, 15d supply, fill #0

## 2023-02-02 ENCOUNTER — Encounter: Payer: Self-pay | Admitting: Family Medicine

## 2023-02-02 ENCOUNTER — Other Ambulatory Visit (HOSPITAL_BASED_OUTPATIENT_CLINIC_OR_DEPARTMENT_OTHER): Payer: Self-pay

## 2023-02-02 ENCOUNTER — Ambulatory Visit (INDEPENDENT_AMBULATORY_CARE_PROVIDER_SITE_OTHER): Payer: BC Managed Care – PPO | Admitting: Family Medicine

## 2023-02-02 VITALS — BP 119/63 | HR 69 | Ht 65.0 in | Wt 166.8 lb

## 2023-02-02 DIAGNOSIS — B028 Zoster with other complications: Secondary | ICD-10-CM | POA: Diagnosis not present

## 2023-02-02 DIAGNOSIS — B029 Zoster without complications: Secondary | ICD-10-CM | POA: Insufficient documentation

## 2023-02-02 NOTE — Patient Instructions (Signed)
Take gabapentin for nerve pain as needed  Flexiril can be taken for neck spasms  If you take prednisone you can take 10mg  but take it for no longer than 7 days as it would require a taper

## 2023-02-02 NOTE — Progress Notes (Signed)
Established patient visit   Patient: Lydia Torres   DOB: 1975-12-16   47 y.o. Female  MRN: [id number] Visit Date: 02/02/2023  Today's healthcare provider: Charlton Amor, DO   Chief Complaint  Patient presents with   Ear Pain    Right ear    SUBJECTIVE    Chief Complaint  Patient presents with   Ear Pain    Right ear   HPI HPI     Ear Pain    Additional comments: Right ear      Last edited by Roselyn Reef, CMA on 02/02/2023  9:34 AM.      Pt presents for evaluation of Left ear pain. She was seen earlier this week and had negative blood work. Pt started to develop blisters on lips and shoulder. She had some valtrex on hand and started taking it and noticed pain being relieved. Did note some pain in her right ear. Lymph node swelling has greatly improved.    Review of Systems  Constitutional:  Negative for activity change, fatigue and fever.  Respiratory:  Negative for cough and shortness of breath.   Cardiovascular:  Negative for chest pain.  Gastrointestinal:  Negative for abdominal pain.  Genitourinary:  Negative for difficulty urinating.       Current Meds  Medication Sig   b complex vitamins capsule Take 1 capsule by mouth daily.   Cholecalciferol (VITAMIN D3) 250 MCG (10000 UT) TABS Take by mouth.   cyclobenzaprine (FLEXERIL) 10 MG tablet Take 1 tablet (10 mg total) by mouth 2 (two) times daily as needed for muscle spasms.   escitalopram (LEXAPRO) 5 MG tablet Take 1 tablet (5 mg total) by mouth daily.   EUFLEXXA 20 MG/2ML SOSY SMARTSIG:I-ARTIC   gabapentin (NEURONTIN) 100 MG capsule Take 1 capsule (100 mg total) by mouth 3 (three) times daily.   levonorgestrel (MIRENA) 20 MCG/24HR IUD 1 each by Intrauterine route once. Reported on 05/12/2015   magnesium gluconate (MAGONATE) 500 MG tablet Take 500 mg by mouth 2 (two) times daily.   methylPREDNISolone (MEDROL DOSEPAK) 4 MG TBPK tablet Follow per package instructions.   nystatin (MYCOSTATIN) 500000  units TABS tablet Take 1 tablet (500,000 Units total) by mouth 2 (two) times daily.   Omega-3 Fatty Acids (FISH OIL) 1000 MG CAPS Take 1,000 mg by mouth daily.   omeprazole (PRILOSEC) 20 MG capsule Take 1 capsule (20 mg total) by mouth daily.   tirzepatide (ZEPBOUND) 5 MG/0.5ML Pen Inject 5 mg into the skin once a week.   tirzepatide (ZEPBOUND) 7.5 MG/0.5ML Pen Inject 7.5 mg into the skin once a week.   tretinoin (RETIN-A) 0.05 % cream Apply a pea-sized amount to face once at night   valACYclovir (VALTREX) 1000 MG tablet Take 1 tablet (1,000 mg total) by mouth 2 (two) times daily.   valACYclovir (VALTREX) 1000 MG tablet Take 1 tablet (1,000 mg total) by mouth 3 (three) times daily for 7 days.    OBJECTIVE    BP 119/63 (BP Location: Left Arm, Patient Position: Sitting, Cuff Size: Normal)   Pulse 69   Ht 5\' 5"  (1.651 m)   Wt 166 lb 12 oz (75.6 kg)   SpO2 100%   BMI 27.75 kg/m   Physical Exam Vitals and nursing note reviewed.  Constitutional:      General: She is not in acute distress.    Appearance: Normal appearance.  HENT:     Head: Normocephalic and atraumatic.     Right Ear:  External ear normal.     Left Ear: External ear normal.     Nose: Nose normal.  Eyes:     Conjunctiva/sclera: Conjunctivae normal.  Cardiovascular:     Rate and Rhythm: Normal rate and regular rhythm.  Pulmonary:     Effort: Pulmonary effort is normal.     Breath sounds: Normal breath sounds.  Neurological:     General: No focal deficit present.     Mental Status: She is alert and oriented to person, place, and time.  Psychiatric:        Mood and Affect: Mood normal.        Behavior: Behavior normal.        Thought Content: Thought content normal.        Judgment: Judgment normal.        ASSESSMENT & PLAN    Problem List Items Addressed This Visit       Nervous and Auditory   Shingles - Primary    No rash located in right ear canal but patient is sensitive to touch in that area. No  erythema of canal so less likely otitis externa. With relief with valtrex it is believed pt has shingles. Also notes some facial pain and some paralysis the other day. 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      No orders of the defined types were placed in this encounter.   Orders Placed This Encounter  Procedures   Varicella Zoster Abs, IgG/IgM     Charlton Amor, DO  [facility] at [facility] [phone] (phone) [phone] (fax)  [facility]

## 2023-02-02 NOTE — Assessment & Plan Note (Signed)
No rash located in right ear canal but patient is sensitive to touch in that area. No erythema of canal so less likely otitis externa. With relief with valtrex it is believed pt has shingles. Also notes some facial pain and some paralysis the other day. 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

## 2023-02-05 ENCOUNTER — Encounter: Payer: Self-pay | Admitting: Family Medicine

## 2023-02-06 ENCOUNTER — Other Ambulatory Visit (HOSPITAL_BASED_OUTPATIENT_CLINIC_OR_DEPARTMENT_OTHER): Payer: Self-pay

## 2023-02-06 LAB — VARICELLA ZOSTER ABS, IGG/IGM: Varicella zoster IgG: REACTIVE

## 2023-02-07 ENCOUNTER — Other Ambulatory Visit (HOSPITAL_BASED_OUTPATIENT_CLINIC_OR_DEPARTMENT_OTHER): Payer: Self-pay

## 2023-02-07 ENCOUNTER — Other Ambulatory Visit: Payer: Self-pay | Admitting: Family Medicine

## 2023-02-07 DIAGNOSIS — E66811 Other obesity due to excess calories: Secondary | ICD-10-CM

## 2023-02-08 ENCOUNTER — Other Ambulatory Visit: Payer: Self-pay | Admitting: Family Medicine

## 2023-02-08 ENCOUNTER — Other Ambulatory Visit (HOSPITAL_BASED_OUTPATIENT_CLINIC_OR_DEPARTMENT_OTHER): Payer: Self-pay

## 2023-02-08 MED ORDER — ZEPBOUND 2.5 MG/0.5ML ~~LOC~~ SOAJ
2.5000 mg | SUBCUTANEOUS | 0 refills | Status: DC
Start: 2023-02-08 — End: 2023-08-07
  Filled 2023-02-08: qty 2, 28d supply, fill #0

## 2023-02-17 IMAGING — MG MM DIGITAL SCREENING BILAT W/ TOMO AND CAD
8 series · 9 of 24 positions shown · non-contrast
Comparison: Previous exam(s).

CLINICAL DATA: Screening.

EXAM:
DIGITAL SCREENING BILATERAL MAMMOGRAM WITH TOMOSYNTHESIS AND CAD
TECHNIQUE: Bilateral screening digital craniocaudal and mediolateral oblique
mammograms were obtained. Bilateral screening digital breast
tomosynthesis was performed. The images were evaluated with
computer-aided detection.

[R MLO synth-2D]
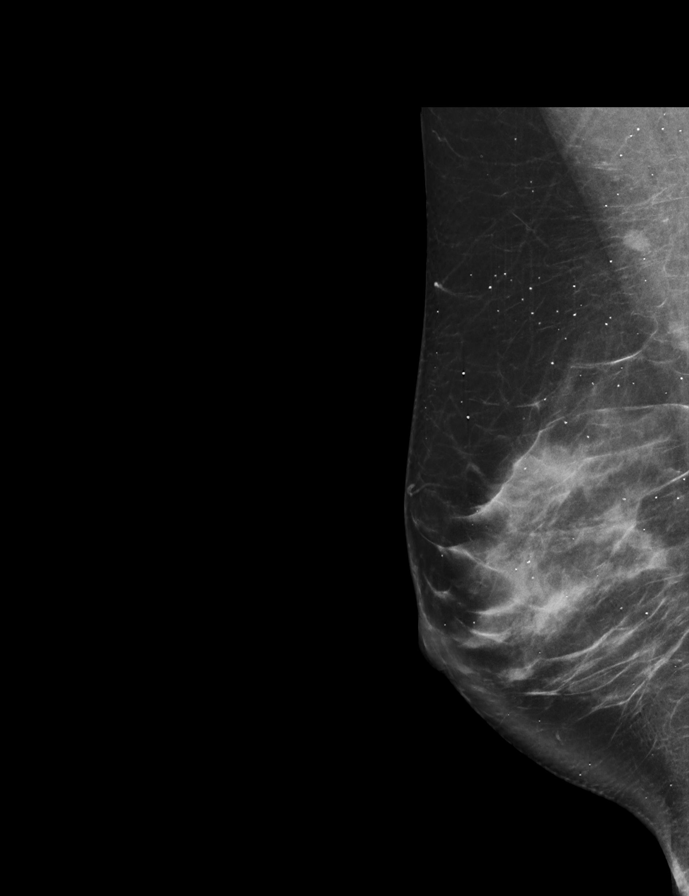

[R CC synth-2D]
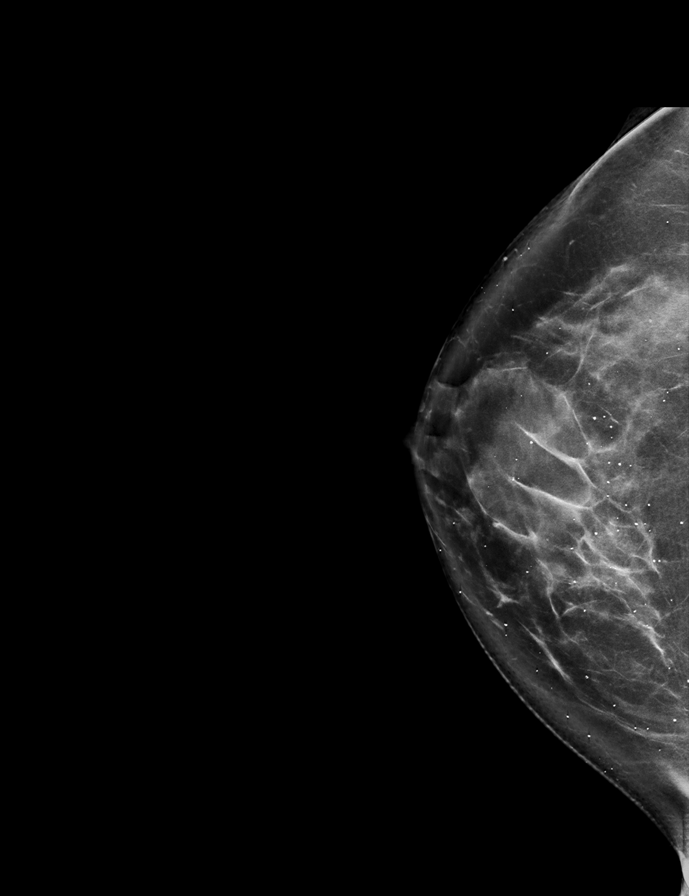

[L MLO synth-2D]
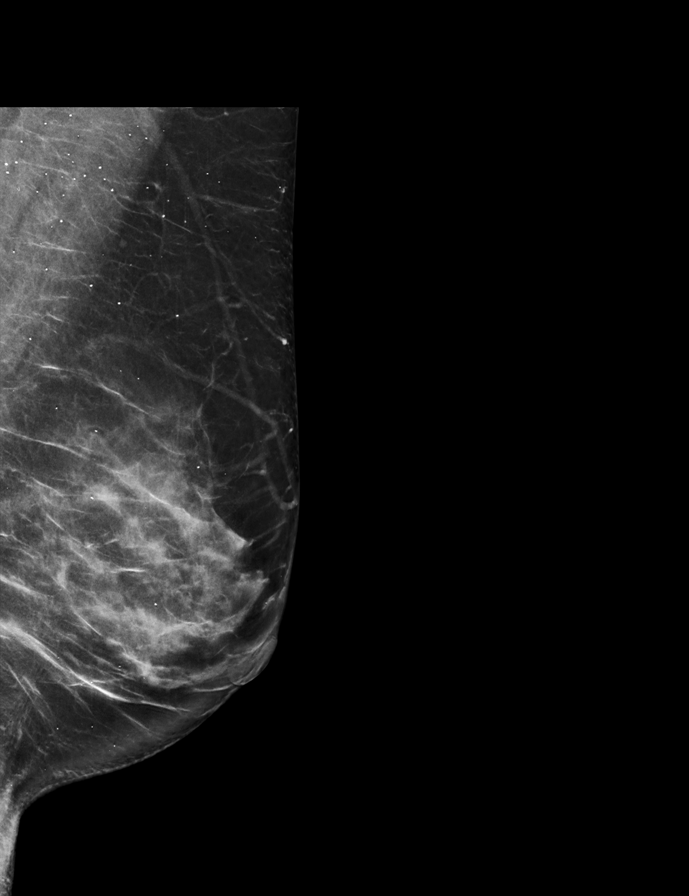

[L CC synth-2D]
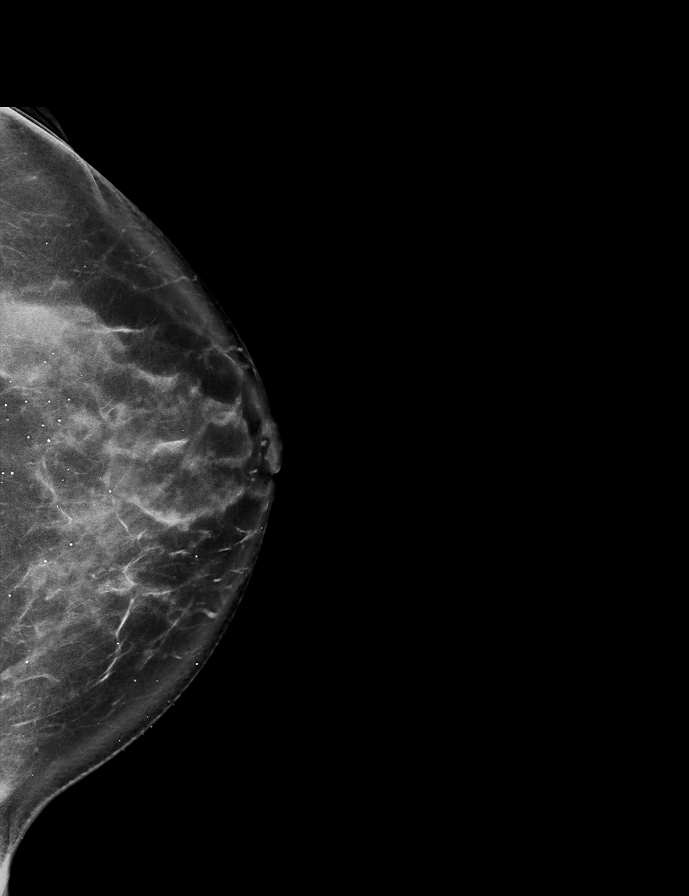

[R MLO tomo · 2 of 81 frames shown]
[frame 27/81]
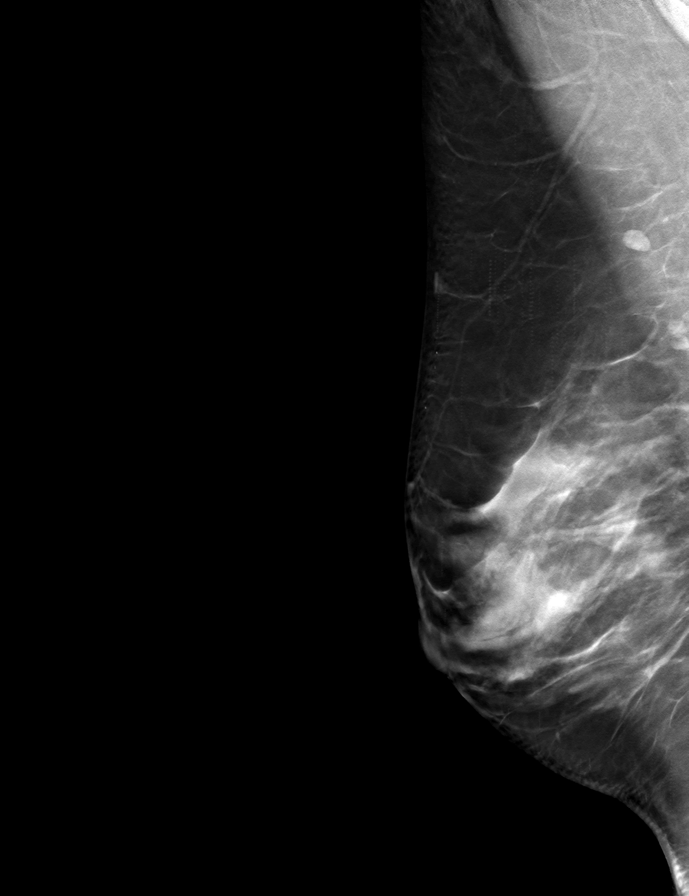
[frame 41/81]
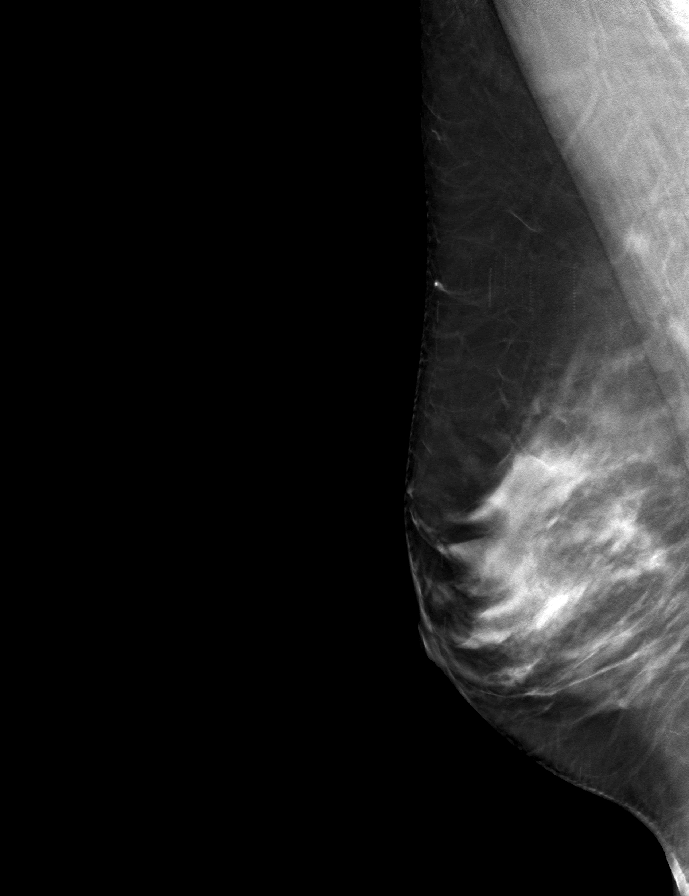

[L CC tomo · tomo slice 43/84.0]
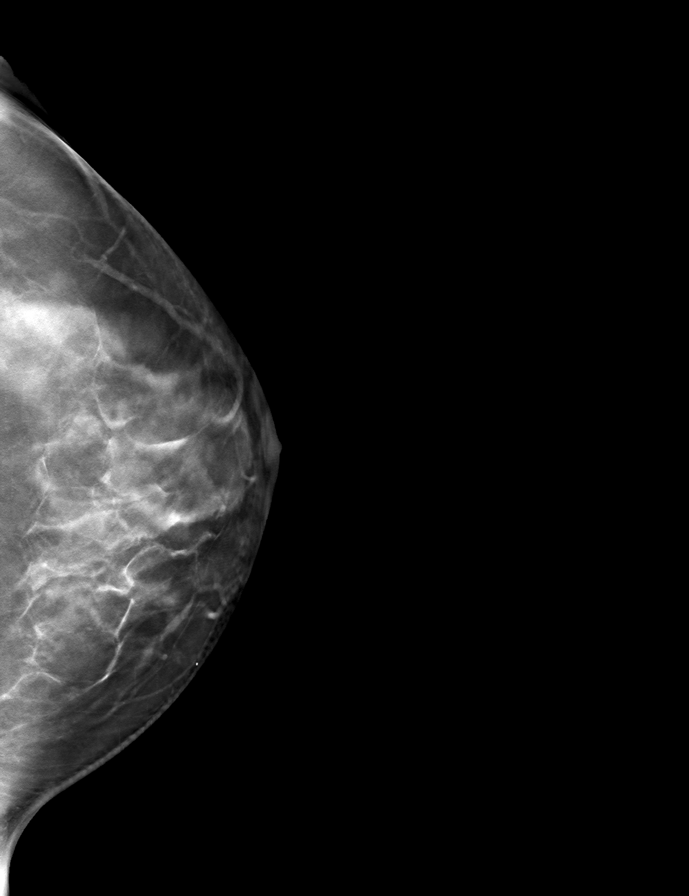

[L MLO tomo · tomo slice 38/75.0]
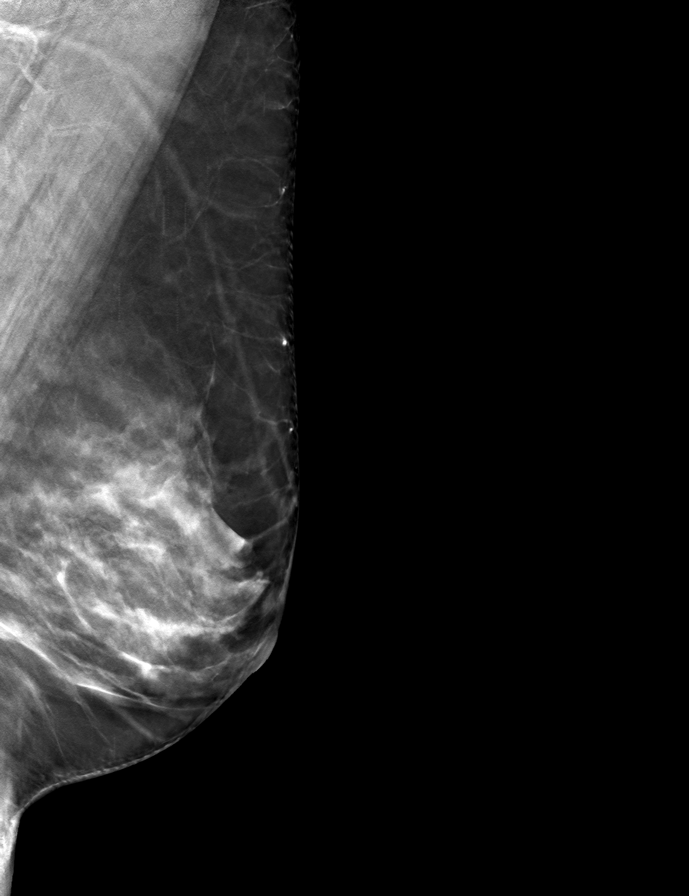

[R CC tomo · tomo slice 43/85.0]
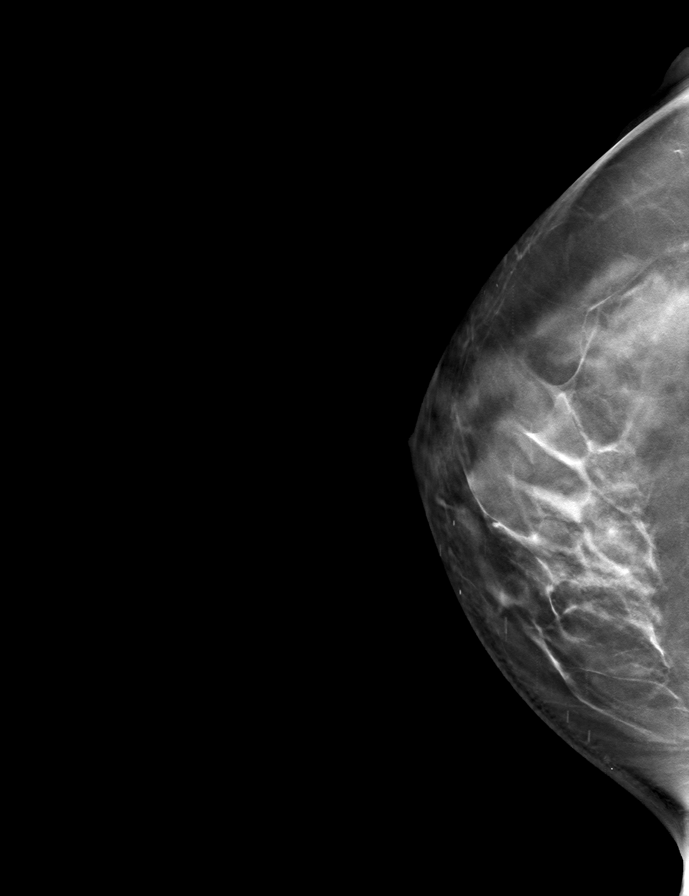

[9 of 24 positions shown; findings below may reference images not displayed]

ACR Breast Density Category c: The breast tissue is heterogeneously
dense, which may obscure small masses.
FINDINGS: There are no findings suspicious for malignancy.
IMPRESSION: No mammographic evidence of malignancy. A result letter of this
screening mammogram will be mailed directly to the patient.

RECOMMENDATION:
Screening mammogram in one year. (Code:Q3-W-BC3)

BI-RADS CATEGORY  1: Negative.

## 2023-02-19 ENCOUNTER — Other Ambulatory Visit: Payer: Self-pay | Admitting: Obstetrics and Gynecology

## 2023-02-19 DIAGNOSIS — Z1231 Encounter for screening mammogram for malignant neoplasm of breast: Secondary | ICD-10-CM

## 2023-02-20 ENCOUNTER — Other Ambulatory Visit: Payer: Self-pay | Admitting: Family Medicine

## 2023-02-20 ENCOUNTER — Encounter: Payer: Self-pay | Admitting: Family Medicine

## 2023-02-20 DIAGNOSIS — H9209 Otalgia, unspecified ear: Secondary | ICD-10-CM

## 2023-02-21 ENCOUNTER — Encounter (INDEPENDENT_AMBULATORY_CARE_PROVIDER_SITE_OTHER): Payer: Self-pay | Admitting: Otolaryngology

## 2023-02-21 DIAGNOSIS — H6122 Impacted cerumen, left ear: Secondary | ICD-10-CM | POA: Diagnosis not present

## 2023-02-21 DIAGNOSIS — B029 Zoster without complications: Secondary | ICD-10-CM | POA: Diagnosis not present

## 2023-02-28 ENCOUNTER — Other Ambulatory Visit (HOSPITAL_BASED_OUTPATIENT_CLINIC_OR_DEPARTMENT_OTHER): Payer: Self-pay

## 2023-03-09 DIAGNOSIS — M9902 Segmental and somatic dysfunction of thoracic region: Secondary | ICD-10-CM | POA: Diagnosis not present

## 2023-03-09 DIAGNOSIS — M9903 Segmental and somatic dysfunction of lumbar region: Secondary | ICD-10-CM | POA: Diagnosis not present

## 2023-03-09 DIAGNOSIS — M9901 Segmental and somatic dysfunction of cervical region: Secondary | ICD-10-CM | POA: Diagnosis not present

## 2023-03-09 DIAGNOSIS — G4486 Cervicogenic headache: Secondary | ICD-10-CM | POA: Diagnosis not present

## 2023-03-09 DIAGNOSIS — M9906 Segmental and somatic dysfunction of lower extremity: Secondary | ICD-10-CM | POA: Diagnosis not present

## 2023-03-12 ENCOUNTER — Encounter: Payer: Self-pay | Admitting: Family Medicine

## 2023-03-12 ENCOUNTER — Other Ambulatory Visit: Payer: Self-pay

## 2023-03-12 ENCOUNTER — Ambulatory Visit (INDEPENDENT_AMBULATORY_CARE_PROVIDER_SITE_OTHER): Payer: BC Managed Care – PPO | Admitting: Family Medicine

## 2023-03-12 ENCOUNTER — Other Ambulatory Visit (HOSPITAL_BASED_OUTPATIENT_CLINIC_OR_DEPARTMENT_OTHER): Payer: Self-pay

## 2023-03-12 VITALS — BP 107/84 | HR 79 | Ht 65.0 in | Wt 168.0 lb

## 2023-03-12 DIAGNOSIS — M5412 Radiculopathy, cervical region: Secondary | ICD-10-CM | POA: Insufficient documentation

## 2023-03-12 DIAGNOSIS — M542 Cervicalgia: Secondary | ICD-10-CM

## 2023-03-12 DIAGNOSIS — E6609 Other obesity due to excess calories: Secondary | ICD-10-CM

## 2023-03-12 DIAGNOSIS — M5431 Sciatica, right side: Secondary | ICD-10-CM | POA: Diagnosis not present

## 2023-03-12 DIAGNOSIS — E66811 Obesity, class 1: Secondary | ICD-10-CM | POA: Diagnosis not present

## 2023-03-12 DIAGNOSIS — Z6830 Body mass index (BMI) 30.0-30.9, adult: Secondary | ICD-10-CM

## 2023-03-12 MED ORDER — ZEPBOUND 5 MG/0.5ML ~~LOC~~ SOAJ
5.0000 mg | SUBCUTANEOUS | 4 refills | Status: DC
  Filled 2023-03-12: qty 2, 28d supply, fill #0

## 2023-03-12 MED ORDER — METHYLPREDNISOLONE 4 MG PO TBPK
ORAL_TABLET | ORAL | 0 refills | Status: DC
  Filled 2023-03-12: qty 21, 6d supply, fill #0

## 2023-03-12 NOTE — Progress Notes (Signed)
Established patient visit   Patient: Lydia Torres   DOB: [birth year]   47 y.o. Female  MRN: [id number] Visit Date: 03/12/2023  Today's healthcare provider: Charlton Amor, DO   Chief Complaint  Patient presents with   Medical Management of Chronic Issues    Zepbound f/u    SUBJECTIVE    Chief Complaint  Patient presents with   Medical Management of Chronic Issues    Zepbound f/u   HPI HPI     Medical Management of Chronic Issues    Additional comments: Zepbound f/u      Last edited by Roselyn Reef, CMA on 03/12/2023  9:34 AM.       Pt presents for follow up on zepbound. Doing well on 2.5mg  and ready to titrate up to 5mg .   Still having neck pain on the right side and radiculopathy.   Has been having some sciatic nerve pain down right leg.   Review of Systems  Constitutional:  Negative for activity change, fatigue and fever.  Respiratory:  Negative for cough and shortness of breath.   Cardiovascular:  Negative for chest pain.  Gastrointestinal:  Negative for abdominal pain.  Genitourinary:  Negative for difficulty urinating.  Musculoskeletal:  Positive for neck pain.       Sciatica pain       Current Meds  Medication Sig   b complex vitamins capsule Take 1 capsule by mouth daily.   Cholecalciferol (VITAMIN D3) 250 MCG (10000 UT) TABS Take by mouth.   cyclobenzaprine (FLEXERIL) 10 MG tablet Take 1 tablet (10 mg total) by mouth 2 (two) times daily as needed for muscle spasms.   escitalopram (LEXAPRO) 5 MG tablet Take 1 tablet (5 mg total) by mouth daily.   EUFLEXXA 20 MG/2ML SOSY SMARTSIG:I-ARTIC   gabapentin (NEURONTIN) 100 MG capsule Take 1 capsule (100 mg total) by mouth 3 (three) times daily.   levonorgestrel (MIRENA) 20 MCG/24HR IUD 1 each by Intrauterine route once. Reported on 05/12/2015   magnesium gluconate (MAGONATE) 500 MG tablet Take 500 mg by mouth 2 (two) times daily.   nystatin (MYCOSTATIN) 500000 units TABS tablet Take 1 tablet  (500,000 Units total) by mouth 2 (two) times daily.   Omega-3 Fatty Acids (FISH OIL) 1000 MG CAPS Take 1,000 mg by mouth daily.   omeprazole (PRILOSEC) 20 MG capsule Take 1 capsule (20 mg total) by mouth daily.   tirzepatide (ZEPBOUND) 2.5 MG/0.5ML Pen Inject 2.5 mg into the skin once a week.   tretinoin (RETIN-A) 0.05 % cream Apply a pea-sized amount to face once at night   valACYclovir (VALTREX) 1000 MG tablet Take 1 tablet (1,000 mg total) by mouth 2 (two) times daily.   [DISCONTINUED] methylPREDNISolone (MEDROL DOSEPAK) 4 MG TBPK tablet Follow per package instructions.   [DISCONTINUED] tirzepatide (ZEPBOUND) 5 MG/0.5ML Pen Inject 5 mg into the skin once a week.    OBJECTIVE    BP 107/84 (BP Location: Left Arm, Patient Position: Sitting, Cuff Size: Normal)   Pulse 79   Ht 5\' 5"  (1.651 m)   Wt 168 lb (76.2 kg)   SpO2 100%   BMI 27.96 kg/m   Physical Exam Vitals and nursing note reviewed.  Constitutional:      General: She is not in acute distress.    Appearance: Normal appearance.  HENT:     Head: Normocephalic and atraumatic.     Right Ear: External ear normal.     Left Ear: External ear normal.  Nose: Nose normal.  Eyes:     Conjunctiva/sclera: Conjunctivae normal.  Cardiovascular:     Rate and Rhythm: Normal rate and regular rhythm.  Pulmonary:     Effort: Pulmonary effort is normal.     Breath sounds: Normal breath sounds.  Neurological:     General: No focal deficit present.     Mental Status: She is alert and oriented to person, place, and time.  Psychiatric:        Mood and Affect: Mood normal.        Behavior: Behavior normal.        Thought Content: Thought content normal.        Judgment: Judgment normal.        ASSESSMENT & PLAN    Problem List Items Addressed This Visit       Nervous and Auditory   Sciatica of right side    Recommended PT and patient says she will contact her PT  - also discussed medrol dose pack to be given in case  inflammation is bad/she flares over the holidays. Have sent in as prn med  - if no improvement, will need imagining       Relevant Medications   tirzepatide (ZEPBOUND) 5 MG/0.5ML Pen   methylPREDNISolone (MEDROL DOSEPAK) 4 MG TBPK tablet   Cervical radiculopathy - Primary    Pt notes continued cervical radiculopathy - xrays done were negative.  - will go ahead and order MRI      Relevant Medications   tirzepatide (ZEPBOUND) 5 MG/0.5ML Pen   Other Relevant Orders   MR CERVICAL SPINE WO CONTRAST     Other   Class 1 obesity due to excess calories with serious comorbidity and body mass index (BMI) of 30.0 to 30.9 in adult    Pt doing well on zepbound, we will go ahead and increase dosage.       Relevant Medications   tirzepatide (ZEPBOUND) 5 MG/0.5ML Pen   Other Visit Diagnoses     Cervicalgia       Relevant Orders   MR CERVICAL SPINE WO CONTRAST       No follow-ups on file.      Meds ordered this encounter  Medications   tirzepatide (ZEPBOUND) 5 MG/0.5ML Pen    Sig: Inject 5 mg into the skin once a week.    Dispense:  2 mL    Refill:  4   methylPREDNISolone (MEDROL DOSEPAK) 4 MG TBPK tablet    Sig: Follow per package instructions.    Dispense:  21 tablet    Refill:  0    Orders Placed This Encounter  Procedures   MR CERVICAL SPINE WO CONTRAST    Standing Status:   Future    Standing Expiration Date:   03/11/2024    Order Specific Question:   What is the patient's sedation requirement?    Answer:   No Sedation    Order Specific Question:   Does the patient have a pacemaker or implanted devices?    Answer:   No    Order Specific Question:   Preferred imaging location?    Answer:   [facility] (table limit-350lbs)    Order Specific Question:   Radiology Contrast Protocol - do NOT remove file path    Answer:   \\[email or web address]     Charlton Amor, DO  [facility] at [facility] ([place]) [phone] (phone) [phone] (fax)  [facility]

## 2023-03-12 NOTE — Assessment & Plan Note (Signed)
Pt doing well on zepbound, we will go ahead and increase dosage.

## 2023-03-12 NOTE — Assessment & Plan Note (Signed)
Pt notes continued cervical radiculopathy - xrays done were negative.  - will go ahead and order MRI

## 2023-03-12 NOTE — Assessment & Plan Note (Addendum)
Recommended PT and patient says she will contact her PT  - also discussed medrol dose pack to be given in case inflammation is bad/she flares over the holidays. Have sent in as prn med  - if no improvement, will need imagining

## 2023-03-13 NOTE — Progress Notes (Signed)
   03/13/2023  Patient ID: Lydia Torres, female   DOB: March 15, 1976, 47 y.o.   MRN: [id number]  Clinic routed request from Dr. Tamera Punt inquiring about insurance coverage for Shingrix vaccine in patient <50 with recent history of shingles.  Test claim shows the vaccine would be covered fully at a retail pharmacy.  However based on CDC recommendations, pharmacist may be hesitant to administer.  CDC recommends the immunization in patients 46yo+ unless >19yo and immunocompromised.  Information provided to Dr. Tamera Punt.  Lenna Gilford, PharmD, DPLA

## 2023-03-14 ENCOUNTER — Encounter: Payer: Self-pay | Admitting: Family Medicine

## 2023-03-15 ENCOUNTER — Other Ambulatory Visit (HOSPITAL_BASED_OUTPATIENT_CLINIC_OR_DEPARTMENT_OTHER): Payer: Self-pay

## 2023-03-15 ENCOUNTER — Other Ambulatory Visit: Payer: Self-pay | Admitting: Family Medicine

## 2023-03-15 DIAGNOSIS — Z8659 Personal history of other mental and behavioral disorders: Secondary | ICD-10-CM

## 2023-03-15 MED ORDER — ESCITALOPRAM OXALATE 5 MG PO TABS
5.0000 mg | ORAL_TABLET | Freq: Every day | ORAL | 3 refills | Status: DC
  Filled 2023-03-15: qty 30, 30d supply, fill #0
  Filled 2023-05-19: qty 30, 30d supply, fill #1
  Filled 2023-07-06: qty 30, 30d supply, fill #2
  Filled 2023-08-06: qty 30, 30d supply, fill #3
  Filled 2023-09-13: qty 30, 30d supply, fill #4
  Filled 2023-10-22: qty 30, 30d supply, fill #5
  Filled 2023-11-26: qty 30, 30d supply, fill #6
  Filled 2023-12-24: qty 30, 30d supply, fill #7
  Filled 2024-01-14: qty 30, 30d supply, fill #8
  Filled 2024-02-20: qty 30, 30d supply, fill #9

## 2023-03-16 ENCOUNTER — Other Ambulatory Visit (HOSPITAL_BASED_OUTPATIENT_CLINIC_OR_DEPARTMENT_OTHER): Payer: Self-pay

## 2023-03-19 ENCOUNTER — Ambulatory Visit
Admission: RE | Admit: 2023-03-19 | Discharge: 2023-03-19 | Disposition: A | Discharge status: Home / Self Care | Payer: BC Managed Care – PPO | Source: Ambulatory Visit | Attending: Obstetrics and Gynecology | Admitting: Obstetrics and Gynecology

## 2023-03-19 DIAGNOSIS — Z1231 Encounter for screening mammogram for malignant neoplasm of breast: Secondary | ICD-10-CM

## 2023-03-26 ENCOUNTER — Other Ambulatory Visit: Payer: BC Managed Care – PPO

## 2023-03-27 ENCOUNTER — Ambulatory Visit: Payer: BC Managed Care – PPO

## 2023-03-27 DIAGNOSIS — M50221 Other cervical disc displacement at C4-C5 level: Secondary | ICD-10-CM | POA: Diagnosis not present

## 2023-03-27 DIAGNOSIS — M5023 Other cervical disc displacement, cervicothoracic region: Secondary | ICD-10-CM | POA: Diagnosis not present

## 2023-03-27 DIAGNOSIS — M5412 Radiculopathy, cervical region: Secondary | ICD-10-CM

## 2023-03-27 DIAGNOSIS — M47812 Spondylosis without myelopathy or radiculopathy, cervical region: Secondary | ICD-10-CM | POA: Diagnosis not present

## 2023-03-27 DIAGNOSIS — M542 Cervicalgia: Secondary | ICD-10-CM

## 2023-03-27 DIAGNOSIS — M50223 Other cervical disc displacement at C6-C7 level: Secondary | ICD-10-CM | POA: Diagnosis not present

## 2023-03-27 DIAGNOSIS — M50222 Other cervical disc displacement at C5-C6 level: Secondary | ICD-10-CM | POA: Diagnosis not present

## 2023-04-01 ENCOUNTER — Encounter: Payer: Self-pay | Admitting: Family Medicine

## 2023-04-02 ENCOUNTER — Other Ambulatory Visit: Payer: Self-pay | Admitting: Family Medicine

## 2023-04-02 DIAGNOSIS — M542 Cervicalgia: Secondary | ICD-10-CM

## 2023-04-03 ENCOUNTER — Other Ambulatory Visit: Payer: Self-pay | Admitting: Family Medicine

## 2023-04-03 DIAGNOSIS — M542 Cervicalgia: Secondary | ICD-10-CM

## 2023-04-09 ENCOUNTER — Ambulatory Visit: Payer: BC Managed Care – PPO | Admitting: Obstetrics and Gynecology

## 2023-04-09 DIAGNOSIS — M9901 Segmental and somatic dysfunction of cervical region: Secondary | ICD-10-CM | POA: Diagnosis not present

## 2023-04-09 DIAGNOSIS — G4486 Cervicogenic headache: Secondary | ICD-10-CM | POA: Diagnosis not present

## 2023-04-09 DIAGNOSIS — M9902 Segmental and somatic dysfunction of thoracic region: Secondary | ICD-10-CM | POA: Diagnosis not present

## 2023-04-09 DIAGNOSIS — M9903 Segmental and somatic dysfunction of lumbar region: Secondary | ICD-10-CM | POA: Diagnosis not present

## 2023-04-09 DIAGNOSIS — M9906 Segmental and somatic dysfunction of lower extremity: Secondary | ICD-10-CM | POA: Diagnosis not present

## 2023-04-16 ENCOUNTER — Encounter (INDEPENDENT_AMBULATORY_CARE_PROVIDER_SITE_OTHER): Payer: BC Managed Care – PPO | Admitting: Family Medicine

## 2023-04-16 DIAGNOSIS — B0229 Other postherpetic nervous system involvement: Secondary | ICD-10-CM | POA: Diagnosis not present

## 2023-04-16 DIAGNOSIS — M792 Neuralgia and neuritis, unspecified: Secondary | ICD-10-CM

## 2023-04-16 DIAGNOSIS — G6289 Other specified polyneuropathies: Secondary | ICD-10-CM

## 2023-04-18 ENCOUNTER — Other Ambulatory Visit: Payer: Self-pay | Admitting: Obstetrics and Gynecology

## 2023-04-18 ENCOUNTER — Other Ambulatory Visit: Payer: Self-pay | Admitting: Family Medicine

## 2023-04-18 ENCOUNTER — Other Ambulatory Visit (HOSPITAL_BASED_OUTPATIENT_CLINIC_OR_DEPARTMENT_OTHER): Payer: Self-pay

## 2023-04-18 DIAGNOSIS — G629 Polyneuropathy, unspecified: Secondary | ICD-10-CM | POA: Insufficient documentation

## 2023-04-18 DIAGNOSIS — M792 Neuralgia and neuritis, unspecified: Secondary | ICD-10-CM

## 2023-04-18 DIAGNOSIS — Z8619 Personal history of other infectious and parasitic diseases: Secondary | ICD-10-CM

## 2023-04-18 DIAGNOSIS — B0229 Other postherpetic nervous system involvement: Secondary | ICD-10-CM | POA: Insufficient documentation

## 2023-04-18 MED ORDER — GABAPENTIN 100 MG PO CAPS
100.0000 mg | ORAL_CAPSULE | Freq: Two times a day (BID) | ORAL | 2 refills | Status: AC | PRN
  Filled 2023-04-18: qty 60, 30d supply, fill #0
  Filled 2023-10-02: qty 60, 30d supply, fill #1
  Filled 2023-12-10: qty 60, 30d supply, fill #2

## 2023-04-18 MED ORDER — VALACYCLOVIR HCL 500 MG PO TABS
500.0000 mg | ORAL_TABLET | Freq: Every day | ORAL | 3 refills | Status: AC
  Filled 2023-04-18: qty 30, 30d supply, fill #0
  Filled 2023-05-19: qty 30, 30d supply, fill #1
  Filled 2023-08-06: qty 30, 30d supply, fill #2
  Filled 2023-09-13: qty 30, 30d supply, fill #3

## 2023-04-18 NOTE — Telephone Encounter (Signed)
Please see the MyChart message reply(ies) for my assessment and plan.    This patient gave consent for this Medical Advice Message and is aware that it may result in a bill to [employer], as well as the possibility of receiving a bill for a co-payment or deductible. They are an established patient, but are not seeking medical advice exclusively about a problem treated during an in person or video visit in the last seven days. I did not recommend an in person or video visit within seven days of my reply.    I spent a total of 15 minutes cumulative time within 7 days through [employer].  Charlton Amor, DO

## 2023-04-18 NOTE — Telephone Encounter (Signed)
Med refill request:Valtrex  Last AEX:03/07/22 Next AEX: not scheduled  Last MMG (if hormonal med)n/a Refill authorized: Please Advise? Medication refilled by other provider.

## 2023-04-19 ENCOUNTER — Other Ambulatory Visit (HOSPITAL_BASED_OUTPATIENT_CLINIC_OR_DEPARTMENT_OTHER): Payer: Self-pay

## 2023-04-19 ENCOUNTER — Ambulatory Visit
Admission: RE | Admit: 2023-04-19 | Discharge: 2023-04-19 | Disposition: A | Discharge status: Home / Self Care | Payer: BC Managed Care – PPO | Source: Ambulatory Visit | Attending: Obstetrics and Gynecology | Admitting: Obstetrics and Gynecology

## 2023-04-19 DIAGNOSIS — Z1231 Encounter for screening mammogram for malignant neoplasm of breast: Secondary | ICD-10-CM | POA: Diagnosis not present

## 2023-04-19 MED ORDER — DESONIDE 0.05 % EX CREA
1.0000 | TOPICAL_CREAM | Freq: Every day | CUTANEOUS | 0 refills | Status: DC
  Filled 2023-04-19: qty 15, 7d supply, fill #0

## 2023-04-20 DIAGNOSIS — M542 Cervicalgia: Secondary | ICD-10-CM | POA: Diagnosis not present

## 2023-04-20 DIAGNOSIS — M25551 Pain in right hip: Secondary | ICD-10-CM | POA: Diagnosis not present

## 2023-04-20 DIAGNOSIS — M94262 Chondromalacia, left knee: Secondary | ICD-10-CM | POA: Diagnosis not present

## 2023-04-20 DIAGNOSIS — R531 Weakness: Secondary | ICD-10-CM | POA: Diagnosis not present

## 2023-05-09 DIAGNOSIS — M542 Cervicalgia: Secondary | ICD-10-CM | POA: Diagnosis not present

## 2023-05-09 DIAGNOSIS — M94262 Chondromalacia, left knee: Secondary | ICD-10-CM | POA: Diagnosis not present

## 2023-05-09 DIAGNOSIS — Z1331 Encounter for screening for depression: Secondary | ICD-10-CM | POA: Diagnosis not present

## 2023-05-09 DIAGNOSIS — Z01419 Encounter for gynecological examination (general) (routine) without abnormal findings: Secondary | ICD-10-CM | POA: Diagnosis not present

## 2023-05-09 DIAGNOSIS — M25551 Pain in right hip: Secondary | ICD-10-CM | POA: Diagnosis not present

## 2023-05-09 DIAGNOSIS — R531 Weakness: Secondary | ICD-10-CM | POA: Diagnosis not present

## 2023-05-11 ENCOUNTER — Other Ambulatory Visit (HOSPITAL_BASED_OUTPATIENT_CLINIC_OR_DEPARTMENT_OTHER): Payer: Self-pay

## 2023-05-11 MED ORDER — VALACYCLOVIR HCL 1 G PO TABS
2000.0000 mg | ORAL_TABLET | Freq: Two times a day (BID) | ORAL | 3 refills | Status: AC
  Filled 2023-05-11: qty 4, 1d supply, fill #0

## 2023-05-11 MED ORDER — VALACYCLOVIR HCL 1 G PO TABS
2000.0000 mg | ORAL_TABLET | Freq: Two times a day (BID) | ORAL | 3 refills | Status: DC
  Filled 2023-05-11: qty 4, 1d supply, fill #0

## 2023-05-19 ENCOUNTER — Other Ambulatory Visit (HOSPITAL_BASED_OUTPATIENT_CLINIC_OR_DEPARTMENT_OTHER): Payer: Self-pay

## 2023-05-23 DIAGNOSIS — M542 Cervicalgia: Secondary | ICD-10-CM | POA: Diagnosis not present

## 2023-05-24 ENCOUNTER — Other Ambulatory Visit (HOSPITAL_BASED_OUTPATIENT_CLINIC_OR_DEPARTMENT_OTHER): Payer: Self-pay

## 2023-05-31 DIAGNOSIS — D485 Neoplasm of uncertain behavior of skin: Secondary | ICD-10-CM | POA: Diagnosis not present

## 2023-05-31 DIAGNOSIS — D044 Carcinoma in situ of skin of scalp and neck: Secondary | ICD-10-CM | POA: Diagnosis not present

## 2023-06-04 ENCOUNTER — Ambulatory Visit: Payer: BC Managed Care – PPO | Admitting: Family Medicine

## 2023-06-11 DIAGNOSIS — M542 Cervicalgia: Secondary | ICD-10-CM | POA: Diagnosis not present

## 2023-07-02 DIAGNOSIS — M542 Cervicalgia: Secondary | ICD-10-CM | POA: Diagnosis not present

## 2023-07-03 DIAGNOSIS — N6320 Unspecified lump in the left breast, unspecified quadrant: Secondary | ICD-10-CM | POA: Diagnosis not present

## 2023-07-04 ENCOUNTER — Other Ambulatory Visit: Payer: Self-pay | Admitting: Obstetrics and Gynecology

## 2023-07-04 DIAGNOSIS — N6321 Unspecified lump in the left breast, upper outer quadrant: Secondary | ICD-10-CM

## 2023-07-06 ENCOUNTER — Other Ambulatory Visit (HOSPITAL_BASED_OUTPATIENT_CLINIC_OR_DEPARTMENT_OTHER): Payer: Self-pay

## 2023-07-17 ENCOUNTER — Ambulatory Visit
Admission: RE | Admit: 2023-07-17 | Discharge: 2023-07-17 | Disposition: A | Discharge status: Home / Self Care | Source: Ambulatory Visit | Attending: Obstetrics and Gynecology

## 2023-07-17 DIAGNOSIS — N6321 Unspecified lump in the left breast, upper outer quadrant: Secondary | ICD-10-CM

## 2023-07-17 DIAGNOSIS — N6320 Unspecified lump in the left breast, unspecified quadrant: Secondary | ICD-10-CM | POA: Diagnosis not present

## 2023-07-25 DIAGNOSIS — M542 Cervicalgia: Secondary | ICD-10-CM | POA: Diagnosis not present

## 2023-08-06 ENCOUNTER — Other Ambulatory Visit (HOSPITAL_COMMUNITY): Payer: Self-pay

## 2023-08-06 ENCOUNTER — Telehealth: Payer: Self-pay

## 2023-08-06 ENCOUNTER — Other Ambulatory Visit (HOSPITAL_BASED_OUTPATIENT_CLINIC_OR_DEPARTMENT_OTHER): Payer: Self-pay

## 2023-08-06 NOTE — Telephone Encounter (Signed)
 Pharmacy Patient Advocate Encounter   Received notification from Patient Pharmacy that prior authorization for Zepbound 2.5 is required/requested.   Insurance verification completed.   The patient is insured through [facility] .   Per test claim: PA required; PA submitted to above mentioned insurance via CoverMyMeds Key/confirmation #/EOC [id number] Status is pending

## 2023-08-07 ENCOUNTER — Other Ambulatory Visit (HOSPITAL_COMMUNITY): Payer: Self-pay

## 2023-08-07 NOTE — Addendum Note (Signed)
 Addended by: Monica Becton on: 08/07/2023 11:45 AM   Modules accepted: Orders

## 2023-08-07 NOTE — Telephone Encounter (Signed)
 That sounds a whole lot more like the actual symptoms from a shingles infection, not a side effect from Zepbound but if she is okay with discontinuing I will take it off of her list and we do not need to do any more investigation.

## 2023-08-07 NOTE — Telephone Encounter (Signed)
 Patient had terrible reaction to zepbound  and can no longer take - has not taken since November Had symptom swollen lymph nodes - skin on fire, nerve pain and then shingles.  She is actually the drug rep for zepbound  She has submitted an adverse event form with the company.  She does not want to pursue a weight loss med for now.  She has been working on diet and with Systems analyst.

## 2023-08-07 NOTE — Telephone Encounter (Signed)
 To Rx prior Auth staff: Good morning, lets go ahead and resubmit, she has had a 12 pound weight loss with the medication so I would consider that good results, I also need to see her back and we can consider titrating up on the dose.  I would resubmit at the 5 mg form and then we will get some additional vitals.    To my staff: Patient needs to be rescheduled with anyone here for a weight loss discussion.

## 2023-08-07 NOTE — Telephone Encounter (Signed)
 Pharmacy Patient Advocate Encounter  Received notification from [facility] that Prior Authorization for Zepbound 2.5 has been DENIED.  Full denial letter will be uploaded to the media tab. See denial reason below.   PA #/Case ID/Reference #: [id number]

## 2023-08-13 ENCOUNTER — Other Ambulatory Visit (HOSPITAL_COMMUNITY): Payer: Self-pay

## 2023-08-22 DIAGNOSIS — M542 Cervicalgia: Secondary | ICD-10-CM | POA: Diagnosis not present

## 2023-09-05 ENCOUNTER — Other Ambulatory Visit (HOSPITAL_BASED_OUTPATIENT_CLINIC_OR_DEPARTMENT_OTHER): Payer: Self-pay

## 2023-09-05 DIAGNOSIS — D485 Neoplasm of uncertain behavior of skin: Secondary | ICD-10-CM | POA: Diagnosis not present

## 2023-09-05 DIAGNOSIS — D235 Other benign neoplasm of skin of trunk: Secondary | ICD-10-CM | POA: Diagnosis not present

## 2023-09-05 DIAGNOSIS — D044 Carcinoma in situ of skin of scalp and neck: Secondary | ICD-10-CM | POA: Diagnosis not present

## 2023-09-05 DIAGNOSIS — L650 Telogen effluvium: Secondary | ICD-10-CM | POA: Diagnosis not present

## 2023-09-05 MED ORDER — FLUOCINOLONE ACETONIDE SCALP 0.01 % EX OIL
TOPICAL_OIL | CUTANEOUS | 2 refills | Status: DC
  Filled 2023-09-05: qty 118.28, 30d supply, fill #0

## 2023-09-06 ENCOUNTER — Encounter: Payer: Self-pay | Admitting: Family Medicine

## 2023-09-13 ENCOUNTER — Other Ambulatory Visit: Payer: Self-pay | Admitting: Family Medicine

## 2023-09-13 ENCOUNTER — Other Ambulatory Visit (HOSPITAL_BASED_OUTPATIENT_CLINIC_OR_DEPARTMENT_OTHER): Payer: Self-pay

## 2023-09-13 NOTE — Telephone Encounter (Unsigned)
 Copied from CRM [id number]. Topic: Clinical - Medication Refill >> Sep 13, 2023 11:17 AM Lydia Torres wrote: Patient is having a bit of a outbreak of fever blisters on the right side of her face with minor numbness in the area. Patient is requesting a treatment supply of the below medication for two weeks to treat the current issue and will discuss a regular dosage of the Valtrex  at her appointment with Sandy Crumb on Monday May 19th, 2025 at 3:00pm.   Medication: valACYclovir  (VALTREX ) 1000 MG tablet  Has the patient contacted their pharmacy? Yes   This is the patient's preferred pharmacy:  [facility] [address] [place] [address] Phone: [phone] Fax: [phone]  Is this the correct pharmacy for this prescription? Yes   Has the prescription been filled recently? No  Is the patient out of the medication? Yes  Has the patient been seen for an appointment in the last year OR does the patient have an upcoming appointment? Yes  Can we respond through MyChart? Yes  Agent: Please be advised that Rx refills may take up to 3 business days. We ask that you follow-up with your pharmacy.

## 2023-09-14 ENCOUNTER — Other Ambulatory Visit (HOSPITAL_BASED_OUTPATIENT_CLINIC_OR_DEPARTMENT_OTHER): Payer: Self-pay

## 2023-09-14 MED ORDER — VALACYCLOVIR HCL 1 G PO TABS
2000.0000 mg | ORAL_TABLET | Freq: Two times a day (BID) | ORAL | 3 refills | Status: DC
  Filled 2023-09-14: qty 4, 1d supply, fill #0
  Filled 2023-09-25: qty 4, 1d supply, fill #1

## 2023-09-17 ENCOUNTER — Ambulatory Visit: Admitting: Physician Assistant

## 2023-09-25 ENCOUNTER — Other Ambulatory Visit (HOSPITAL_BASED_OUTPATIENT_CLINIC_OR_DEPARTMENT_OTHER): Payer: Self-pay

## 2023-09-26 ENCOUNTER — Other Ambulatory Visit (HOSPITAL_BASED_OUTPATIENT_CLINIC_OR_DEPARTMENT_OTHER): Payer: Self-pay

## 2023-09-26 MED ORDER — VALACYCLOVIR HCL 1 G PO TABS
2000.0000 mg | ORAL_TABLET | Freq: Two times a day (BID) | ORAL | 3 refills | Status: DC
  Filled 2023-09-26: qty 4, 1d supply, fill #0

## 2023-10-02 ENCOUNTER — Other Ambulatory Visit (HOSPITAL_BASED_OUTPATIENT_CLINIC_OR_DEPARTMENT_OTHER): Payer: Self-pay

## 2023-10-03 ENCOUNTER — Other Ambulatory Visit: Payer: Self-pay

## 2023-10-03 DIAGNOSIS — Z01419 Encounter for gynecological examination (general) (routine) without abnormal findings: Secondary | ICD-10-CM | POA: Diagnosis not present

## 2023-10-03 DIAGNOSIS — Z124 Encounter for screening for malignant neoplasm of cervix: Secondary | ICD-10-CM | POA: Diagnosis not present

## 2023-10-03 DIAGNOSIS — Z1151 Encounter for screening for human papillomavirus (HPV): Secondary | ICD-10-CM | POA: Diagnosis not present

## 2023-10-03 DIAGNOSIS — Z6829 Body mass index (BMI) 29.0-29.9, adult: Secondary | ICD-10-CM | POA: Diagnosis not present

## 2023-10-03 DIAGNOSIS — R87810 Cervical high risk human papillomavirus (HPV) DNA test positive: Secondary | ICD-10-CM | POA: Diagnosis not present

## 2023-10-04 ENCOUNTER — Other Ambulatory Visit (HOSPITAL_BASED_OUTPATIENT_CLINIC_OR_DEPARTMENT_OTHER): Payer: Self-pay

## 2023-10-04 MED ORDER — VALACYCLOVIR HCL 1 G PO TABS
1000.0000 mg | ORAL_TABLET | Freq: Every day | ORAL | 2 refills | Status: DC
  Filled 2023-10-04: qty 30, 30d supply, fill #0
  Filled 2023-11-01: qty 30, 30d supply, fill #1

## 2023-10-22 ENCOUNTER — Other Ambulatory Visit (HOSPITAL_BASED_OUTPATIENT_CLINIC_OR_DEPARTMENT_OTHER): Payer: Self-pay

## 2023-10-23 ENCOUNTER — Other Ambulatory Visit (HOSPITAL_BASED_OUTPATIENT_CLINIC_OR_DEPARTMENT_OTHER): Payer: Self-pay

## 2023-10-23 ENCOUNTER — Other Ambulatory Visit: Payer: Self-pay

## 2023-10-23 DIAGNOSIS — B009 Herpesviral infection, unspecified: Secondary | ICD-10-CM | POA: Diagnosis not present

## 2023-10-23 DIAGNOSIS — R591 Generalized enlarged lymph nodes: Secondary | ICD-10-CM | POA: Diagnosis not present

## 2023-10-23 DIAGNOSIS — Z1331 Encounter for screening for depression: Secondary | ICD-10-CM | POA: Diagnosis not present

## 2023-10-23 DIAGNOSIS — R21 Rash and other nonspecific skin eruption: Secondary | ICD-10-CM | POA: Diagnosis not present

## 2023-10-23 DIAGNOSIS — B001 Herpesviral vesicular dermatitis: Secondary | ICD-10-CM | POA: Diagnosis not present

## 2023-10-23 DIAGNOSIS — R6889 Other general symptoms and signs: Secondary | ICD-10-CM | POA: Diagnosis not present

## 2023-10-23 MED ORDER — ACYCLOVIR 5 % EX OINT
1.0000 | TOPICAL_OINTMENT | CUTANEOUS | 0 refills | Status: AC
  Filled 2023-10-23: qty 15, 15d supply, fill #0

## 2023-10-23 MED ORDER — VALACYCLOVIR HCL 1 G PO TABS
1000.0000 mg | ORAL_TABLET | Freq: Every day | ORAL | 2 refills | Status: DC
  Filled 2023-10-23: qty 30, 30d supply, fill #0

## 2023-10-26 ENCOUNTER — Other Ambulatory Visit (HOSPITAL_BASED_OUTPATIENT_CLINIC_OR_DEPARTMENT_OTHER): Payer: Self-pay

## 2023-10-26 DIAGNOSIS — Z8042 Family history of malignant neoplasm of prostate: Secondary | ICD-10-CM | POA: Diagnosis not present

## 2023-10-26 DIAGNOSIS — Z803 Family history of malignant neoplasm of breast: Secondary | ICD-10-CM | POA: Diagnosis not present

## 2023-10-26 DIAGNOSIS — R87612 Low grade squamous intraepithelial lesion on cytologic smear of cervix (LGSIL): Secondary | ICD-10-CM | POA: Diagnosis not present

## 2023-11-01 ENCOUNTER — Other Ambulatory Visit (HOSPITAL_BASED_OUTPATIENT_CLINIC_OR_DEPARTMENT_OTHER): Payer: Self-pay

## 2023-11-08 ENCOUNTER — Other Ambulatory Visit (HOSPITAL_BASED_OUTPATIENT_CLINIC_OR_DEPARTMENT_OTHER): Payer: Self-pay

## 2023-11-08 MED ORDER — ACYCLOVIR 800 MG PO TABS
800.0000 mg | ORAL_TABLET | Freq: Two times a day (BID) | ORAL | 5 refills | Status: AC
  Filled 2023-11-08: qty 60, 30d supply, fill #0
  Filled 2023-12-10: qty 60, 30d supply, fill #1
  Filled 2024-01-14: qty 60, 30d supply, fill #2

## 2023-11-14 ENCOUNTER — Other Ambulatory Visit (HOSPITAL_BASED_OUTPATIENT_CLINIC_OR_DEPARTMENT_OTHER): Payer: Self-pay

## 2023-11-21 ENCOUNTER — Other Ambulatory Visit (HOSPITAL_BASED_OUTPATIENT_CLINIC_OR_DEPARTMENT_OTHER): Payer: Self-pay

## 2023-11-26 ENCOUNTER — Other Ambulatory Visit (HOSPITAL_BASED_OUTPATIENT_CLINIC_OR_DEPARTMENT_OTHER): Payer: Self-pay

## 2023-12-05 DIAGNOSIS — L650 Telogen effluvium: Secondary | ICD-10-CM | POA: Diagnosis not present

## 2023-12-05 DIAGNOSIS — D044 Carcinoma in situ of skin of scalp and neck: Secondary | ICD-10-CM | POA: Diagnosis not present

## 2023-12-19 DIAGNOSIS — C44612 Basal cell carcinoma of skin of right upper limb, including shoulder: Secondary | ICD-10-CM | POA: Diagnosis not present

## 2023-12-19 DIAGNOSIS — D485 Neoplasm of uncertain behavior of skin: Secondary | ICD-10-CM | POA: Diagnosis not present

## 2023-12-24 ENCOUNTER — Other Ambulatory Visit (HOSPITAL_BASED_OUTPATIENT_CLINIC_OR_DEPARTMENT_OTHER): Payer: Self-pay

## 2023-12-24 DIAGNOSIS — D8940 Mast cell activation, unspecified: Secondary | ICD-10-CM | POA: Diagnosis not present

## 2023-12-24 DIAGNOSIS — R197 Diarrhea, unspecified: Secondary | ICD-10-CM | POA: Diagnosis not present

## 2023-12-24 DIAGNOSIS — F410 Panic disorder [episodic paroxysmal anxiety] without agoraphobia: Secondary | ICD-10-CM | POA: Diagnosis not present

## 2023-12-24 MED ORDER — PREDNISONE 20 MG PO TABS
40.0000 mg | ORAL_TABLET | Freq: Every day | ORAL | 0 refills | Status: DC
  Filled 2023-12-24: qty 10, 5d supply, fill #0

## 2023-12-24 MED ORDER — HYDROXYZINE HCL 25 MG PO TABS
25.0000 mg | ORAL_TABLET | Freq: Two times a day (BID) | ORAL | 2 refills | Status: AC | PRN
  Filled 2023-12-24: qty 60, 30d supply, fill #0

## 2023-12-26 ENCOUNTER — Other Ambulatory Visit (HOSPITAL_BASED_OUTPATIENT_CLINIC_OR_DEPARTMENT_OTHER): Payer: Self-pay

## 2023-12-26 MED ORDER — ALPRAZOLAM 0.25 MG PO TABS
0.2500 mg | ORAL_TABLET | Freq: Every day | ORAL | 0 refills | Status: AC | PRN
  Filled 2023-12-26: qty 15, 15d supply, fill #0

## 2023-12-26 MED ORDER — EPINEPHRINE 0.3 MG/0.3ML IJ SOAJ
INTRAMUSCULAR | 2 refills | Status: AC
  Filled 2023-12-26: qty 2, 30d supply, fill #0

## 2023-12-26 MED ORDER — ALBUTEROL SULFATE HFA 108 (90 BASE) MCG/ACT IN AERS
2.0000 | INHALATION_SPRAY | Freq: Four times a day (QID) | RESPIRATORY_TRACT | 2 refills | Status: AC | PRN
  Filled 2023-12-26: qty 6.7, 25d supply, fill #0

## 2023-12-27 ENCOUNTER — Other Ambulatory Visit (HOSPITAL_BASED_OUTPATIENT_CLINIC_OR_DEPARTMENT_OTHER): Payer: Self-pay

## 2023-12-28 ENCOUNTER — Other Ambulatory Visit (HOSPITAL_BASED_OUTPATIENT_CLINIC_OR_DEPARTMENT_OTHER): Payer: Self-pay

## 2023-12-28 ENCOUNTER — Other Ambulatory Visit: Payer: Self-pay

## 2023-12-28 DIAGNOSIS — R6889 Other general symptoms and signs: Secondary | ICD-10-CM | POA: Diagnosis not present

## 2023-12-28 DIAGNOSIS — R21 Rash and other nonspecific skin eruption: Secondary | ICD-10-CM | POA: Diagnosis not present

## 2023-12-28 DIAGNOSIS — D8940 Mast cell activation, unspecified: Secondary | ICD-10-CM | POA: Diagnosis not present

## 2023-12-28 MED ORDER — QVAR REDIHALER 80 MCG/ACT IN AERB
1.0000 | INHALATION_SPRAY | Freq: Two times a day (BID) | RESPIRATORY_TRACT | 3 refills | Status: DC
  Filled 2023-12-28: qty 10.6, 30d supply, fill #0

## 2023-12-28 MED ORDER — PREDNISONE 20 MG PO TABS
40.0000 mg | ORAL_TABLET | Freq: Every day | ORAL | 0 refills | Status: AC
  Filled 2023-12-28: qty 10, 5d supply, fill #0

## 2024-01-03 ENCOUNTER — Ambulatory Visit: Admitting: Allergy

## 2024-01-04 ENCOUNTER — Other Ambulatory Visit (HOSPITAL_BASED_OUTPATIENT_CLINIC_OR_DEPARTMENT_OTHER): Payer: Self-pay

## 2024-01-04 MED ORDER — MONTELUKAST SODIUM 10 MG PO TABS
10.0000 mg | ORAL_TABLET | Freq: Every day | ORAL | 2 refills | Status: DC
  Filled 2024-01-04: qty 30, 30d supply, fill #0
  Filled 2024-01-31: qty 30, 30d supply, fill #1
  Filled 2024-02-28: qty 30, 30d supply, fill #2

## 2024-01-08 ENCOUNTER — Other Ambulatory Visit (HOSPITAL_BASED_OUTPATIENT_CLINIC_OR_DEPARTMENT_OTHER): Payer: Self-pay

## 2024-01-08 DIAGNOSIS — D229 Melanocytic nevi, unspecified: Secondary | ICD-10-CM | POA: Diagnosis not present

## 2024-01-08 DIAGNOSIS — L814 Other melanin hyperpigmentation: Secondary | ICD-10-CM | POA: Diagnosis not present

## 2024-01-08 DIAGNOSIS — L821 Other seborrheic keratosis: Secondary | ICD-10-CM | POA: Diagnosis not present

## 2024-01-08 DIAGNOSIS — L578 Other skin changes due to chronic exposure to nonionizing radiation: Secondary | ICD-10-CM | POA: Diagnosis not present

## 2024-01-09 DIAGNOSIS — Z30433 Encounter for removal and reinsertion of intrauterine contraceptive device: Secondary | ICD-10-CM | POA: Diagnosis not present

## 2024-01-14 ENCOUNTER — Other Ambulatory Visit (HOSPITAL_BASED_OUTPATIENT_CLINIC_OR_DEPARTMENT_OTHER): Payer: Self-pay

## 2024-01-15 ENCOUNTER — Other Ambulatory Visit: Payer: Self-pay

## 2024-01-22 ENCOUNTER — Other Ambulatory Visit: Payer: Self-pay

## 2024-01-22 ENCOUNTER — Ambulatory Visit (INDEPENDENT_AMBULATORY_CARE_PROVIDER_SITE_OTHER): Admitting: Allergy & Immunology

## 2024-01-22 ENCOUNTER — Encounter: Payer: Self-pay | Admitting: Allergy & Immunology

## 2024-01-22 VITALS — BP 110/82 | HR 58 | Temp 98.2°F | Resp 16 | Ht 65.25 in | Wt 172.4 lb

## 2024-01-22 DIAGNOSIS — T782XXA Anaphylactic shock, unspecified, initial encounter: Secondary | ICD-10-CM

## 2024-01-22 DIAGNOSIS — T782XXD Anaphylactic shock, unspecified, subsequent encounter: Secondary | ICD-10-CM

## 2024-01-22 DIAGNOSIS — R599 Enlarged lymph nodes, unspecified: Secondary | ICD-10-CM | POA: Diagnosis not present

## 2024-01-22 DIAGNOSIS — L539 Erythematous condition, unspecified: Secondary | ICD-10-CM | POA: Diagnosis not present

## 2024-01-22 DIAGNOSIS — R29810 Facial weakness: Secondary | ICD-10-CM | POA: Diagnosis not present

## 2024-01-22 DIAGNOSIS — R232 Flushing: Secondary | ICD-10-CM

## 2024-01-22 NOTE — Patient Instructions (Addendum)
 Concern for mast cell activation syndrome - Mast cell activation syndrome (MCAS) is a condition that doctors may consider when patients have many different symptoms and certain blood test results. The diagnosis is still debated, and the rules for making it are still changing. Right now, doctors may think about MCAS if the blood test called serum tryptase goes up a lot during a flare of symptoms and then goes back down to normal once the symptoms calm down. Another clue is when symptoms get much better with medicines that block mast cells, like antihistamines (H1 or H2 blockers) or montelukast . - To check for MCAS, we are ordering a bood tests for serum tryptase during symptom flares, usually within 4-6 hours after symptoms start. These tests can be done at a lab or in the emergency room. They help us  see if the tryptase level rises at the same time as symptoms and possible trigger.  - Urine collection jugs provided today with instruction on bringing them back. - In the meantime, we will treat symptoms with a mix of antihistamines and other medicines that help reduce mast cell activity. If symptoms are severe, we may think about adding Xolair (omalizumab), which has been shown to lower the chance of mast cell-related problems, including serious allergic reactions. Also, if you know certain things trigger your symptoms, it is best to avoid those triggers when possible. - Start suppressive dosing of antihistamines:   - Morning: Xyzal (levocetirizine) 1-2 tablets + Pepcid (famotidine) 20mg   - Evening: Xyzal (levocetirizine) 1-2 tablets + Pepcid (famotidine) 20mg  + Singulair  (montelukast ) 10mg   - You can change this dosing at home, decreasing the dose as needed or increasing the dosing as needed, just as you are doing.  - We have some other medications that we can add (such as cromolyn as a mast cell stabilizer), but everything seems to be stabilized with the antihistamines. - If you are not tolerating the  medications or are tired of taking them every day, we can start treatment with a monthly injectable medication called Xolair.  - Xolair handout provided today.  - Emergency Action Plan provided. - EpiPen  training reviewed.  - We will email you more information on mast cell activation syndrome.   2. Return in about 3 months (around 04/22/2024). You can have the follow up appointment with Dr. Iva or a Nurse Practicioner (our Nurse Practitioners are excellent and always have Physician oversight!).    Please inform us  of any Emergency Department visits, hospitalizations, or changes in symptoms. Call us  before going to the ED for breathing or allergy symptoms since we might be able to fit you in for a sick visit. Feel free to contact us  anytime with any questions, problems, or concerns.  It was a pleasure to meet you and your husband today!  Websites that have reliable patient information: 1. American Academy of Asthma, Allergy, and Immunology: www.aaaai.org 2. Food Allergy Research and Education (FARE): foodallergy.org 3. Mothers of Asthmatics: http://www.asthmacommunitynetwork.org 4. American College of Allergy, Asthma, and Immunology: www.acaai.org      "Like" us  on Facebook and Instagram for our latest updates!      A healthy democracy works best when [employer] participate! Make sure you are registered to vote! If you have moved or changed any of your contact information, you will need to get this updated before voting! Scan the QR codes below to learn more!

## 2024-01-22 NOTE — Progress Notes (Unsigned)
 NEW PATIENT  Date of Service/Encounter:  01/22/24  Consult requested by: Bevin Bernice RAMAN, DO (Inactive)   Assessment:   Swollen lymph nodes  Erythema  Flushing  Facial weakness  Plan/Recommendations:   There are no Patient Instructions on file for this visit.   {Blank single:19197::This note in its entirety was forwarded to the Provider who requested this consultation.}  Subjective:   Lydia Torres is a 48 y.o. female presenting today for evaluation of  Chief Complaint  Patient presents with   Establish Care    She states she had allergic reaction since a year- SOB, swelling, hives on neck and chest. She felt hot when flared up and she had a bad stomach pain - diarrhea     Lydia Torres has a history of the following: Patient Active Problem List   Diagnosis Date Noted   Peripheral neuropathy 04/18/2023   Post herpetic neuralgia 04/18/2023   Sciatica of right side 03/12/2023   Cervical radiculopathy 03/12/2023   Shingles 02/02/2023   Polyarthralgia 01/29/2023   Chronic bilateral low back pain without sciatica 01/29/2023   Neck pain 01/29/2023   Lymphadenopathy 01/29/2023   Basal cell carcinoma (BCC) of skin of right upper extremity including shoulder 12/06/2022   Class 1 obesity due to excess calories with serious comorbidity and body mass index (BMI) of 30.0 to 30.9 in adult 12/06/2022   GAD (generalized anxiety disorder) 12/06/2022   Primary osteoarthritis of left knee 12/06/2022   IUD (intrauterine device) in place, Mirena  inserted 06/12/16 07/03/2017   History of cold sores, HSV-1 07/03/2017    History obtained from: chart review and {Persons; PED relatives w/patient:19415::patient}.  Discussed the use of AI scribe software for clinical note transcription with the patient and/or guardian, who gave verbal consent to proceed.  Lydia Torres was referred by Bevin Bernice RAMAN, DO (Inactive).     Zafirah is a 48 y.o. female presenting for {Blank  single:19197::a food challenge,a drug challenge,skin testing,a sick visit,an evaluation of ***,a follow up visit}.    Asthma/Respiratory Symptom History: ***  Allergic Rhinitis Symptom History: ***  Food Allergy Symptom History: ***  Skin Symptom History: ***  GERD Symptom History: ***  Infection Symptom History: ***  ***Otherwise, there is no history of other atopic diseases, including {Blank multiple:19196:o:asthma,food allergies,drug allergies,environmental allergies,stinging insect allergies,eczema,urticaria,contact dermatitis}. There is no significant infectious history. ***Vaccinations are up to date.    Past Medical History: Patient Active Problem List   Diagnosis Date Noted   Peripheral neuropathy 04/18/2023   Post herpetic neuralgia 04/18/2023   Sciatica of right side 03/12/2023   Cervical radiculopathy 03/12/2023   Shingles 02/02/2023   Polyarthralgia 01/29/2023   Chronic bilateral low back pain without sciatica 01/29/2023   Neck pain 01/29/2023   Lymphadenopathy 01/29/2023   Basal cell carcinoma (BCC) of skin of right upper extremity including shoulder 12/06/2022   Class 1 obesity due to excess calories with serious comorbidity and body mass index (BMI) of 30.0 to 30.9 in adult 12/06/2022   GAD (generalized anxiety disorder) 12/06/2022   Primary osteoarthritis of left knee 12/06/2022   IUD (intrauterine device) in place, Mirena  inserted 06/12/16 07/03/2017   History of cold sores, HSV-1 07/03/2017    Medication List:  Allergies as of 01/22/2024       Reactions   Oxycodone     Codeine Rash   Phenergan [promethazine Hcl] Rash   Sulfa Antibiotics Rash        Medication List  Accurate as of January 22, 2024  1:44 PM. If you have any questions, ask your nurse or doctor.          acyclovir  800 MG tablet Commonly known as: ZOVIRAX  Take 1 tablet (800 mg total) by mouth 2 (two) times daily.   acyclovir  ointment 5  % Commonly known as: ZOVIRAX  Apply 1 Application topically every 3 (three) hours.   albuterol  108 (90 Base) MCG/ACT inhaler Commonly known as: VENTOLIN  HFA Inhale 2 puffs into the lungs every 6 (six) hours as needed.   ALPRAZolam  0.25 MG tablet Commonly known as: XANAX  Take 1 tablet (0.25 mg total) by mouth daily as needed for severe panic attack.   b complex vitamins capsule Take 1 capsule by mouth daily.   cyclobenzaprine  10 MG tablet Commonly known as: FLEXERIL  Take 1 tablet (10 mg total) by mouth 2 (two) times daily as needed for muscle spasms.   desonide  0.05 % cream Commonly known as: DESOWEN  Apply 1 Application topically daily for 7 days.   EPINEPHrine  0.3 mg/0.3 mL Soaj injection Commonly known as: EPI-PEN Inject 0.3 mLs (0.3 mg dose) into the muscle once as needed for Anaphylaxis for up to 1 dose.   escitalopram  5 MG tablet Commonly known as: Lexapro  Take 1 tablet (5 mg total) by mouth daily.   Euflexxa 20 MG/2ML Sosy Generic drug: Sodium Hyaluronate (Viscosup) SMARTSIG:I-ARTIC   Fish Oil 1000 MG Caps Take 1,000 mg by mouth daily.   Fluocinolone  Acetonide Scalp 0.01 % Oil Apply 1 application Externally Once a day 30 days   gabapentin  100 MG capsule Commonly known as: NEURONTIN  Take 1 capsule (100 mg total) by mouth 2 (two) times daily as needed.   hydrOXYzine  25 MG tablet Commonly known as: ATARAX  Take 1 tablet (25 mg total) by mouth 2 (two) times daily as needed.   levonorgestrel  20 MCG/24HR IUD Commonly known as: MIRENA  1 each by Intrauterine route once. Reported on 05/12/2015   magnesium  gluconate 500 MG tablet Commonly known as: MAGONATE Take 500 mg by mouth 2 (two) times daily.   methylPREDNISolone  4 MG Tbpk tablet Commonly known as: MEDROL  DOSEPAK Follow per package instructions.   montelukast  10 MG tablet Commonly known as: SINGULAIR  Take 1 tablet (10 mg total) by mouth daily.   nystatin  500000 units Tabs tablet Commonly known as:  MYCOSTATIN  Take 1 tablet (500,000 Units total) by mouth 2 (two) times daily.   omeprazole  20 MG capsule Commonly known as: PRILOSEC Take 1 capsule (20 mg total) by mouth daily.   Qvar  RediHaler 80 MCG/ACT inhaler Generic drug: beclomethasone Inhale 1 puff into the lungs 2 (two) times daily.   Retin-A  0.05 % cream Generic drug: tretinoin  Apply a pea-sized amount to face once at night   valACYclovir  500 MG tablet Commonly known as: Valtrex  Take 1 tablet (500 mg total) by mouth daily.   valACYclovir  1000 MG tablet Commonly known as: VALTREX  Take 2 tablets (2,000 mg total) by mouth every 12 (twelve) hours for 1 day.   valACYclovir  1000 MG tablet Commonly known as: Valtrex  Take 2 tablets (2,000 mg total) by mouth every 12 (twelve) hours for 1 day   valACYclovir  1000 MG tablet Commonly known as: Valtrex  Take 2 tablets (2,000 mg total) by mouth every 12 (twelve) hours.   valACYclovir  1000 MG tablet Commonly known as: Valtrex  Take 1 tablet (1,000 mg total) by mouth daily.   Vitamin D3 250 MCG (10000 UT) Tabs Take by mouth.        Birth History: {  Blank single:19197::non-contributory,born premature and spent time in the NICU,born at term without complications}  Developmental History: Agapita has met all milestones on time. She has required no {Blank multiple:19196:a:speech therapy,occupational therapy,physical therapy}. ***non-contributory  Past Surgical History: Past Surgical History:  Procedure Laterality Date   BUNIONECTOMY Right 05/2015   WISDOM TOOTH EXTRACTION       Family History: Family History  Problem Relation Age of Onset   Diabetes Mother    Depression Mother    Anxiety disorder Mother    Liver disease Father    Hypertension Father    Prostate cancer Father 60   Schizophrenia Brother    Anxiety disorder Brother    Depression Brother    Breast cancer Paternal Aunt        2 paternal aunts with breast CA - BrCA negative   Breast cancer  Paternal Aunt    Asthma Paternal Grandmother    Colon cancer Neg Hx    Esophageal cancer Neg Hx    Stomach cancer Neg Hx    Rectal cancer Neg Hx      Social History: Hani lives at home with ***.    Review of systems otherwise negative other than that mentioned in the HPI.    Objective:   Blood pressure 110/82, pulse (!) 58, temperature 98.2 F (36.8 C), temperature source Temporal, resp. rate 16, height 5' 5.25 (1.657 m), weight 172 lb 6.4 oz (78.2 kg), SpO2 100%. Body mass index is 28.47 kg/m.     Physical Exam   Diagnostic studies: {Blank single:19197::none,deferred due to recent antihistamine use,deferred due to insurance stipulations that require a separate visit for testing,labs sent instead, }  Spirometry: {Blank single:19197::results normal (FEV1: ***%, FVC: ***%, FEV1/FVC: ***%),results abnormal (FEV1: ***%, FVC: ***%, FEV1/FVC: ***%)}.    {Blank single:19197::Spirometry consistent with mild obstructive disease,Spirometry consistent with moderate obstructive disease,Spirometry consistent with severe obstructive disease,Spirometry consistent with possible restrictive disease,Spirometry consistent with mixed obstructive and restrictive disease,Spirometry uninterpretable due to technique,Spirometry consistent with normal pattern}. {Blank single:19197::Albuterol /Atrovent nebulizer,Xopenex/Atrovent nebulizer,Albuterol  nebulizer,Albuterol  four puffs via MDI,Xopenex four puffs via MDI} treatment given in clinic with {Blank single:19197::significant improvement in FEV1 per ATS criteria,significant improvement in FVC per ATS criteria,significant improvement in FEV1 and FVC per ATS criteria,improvement in FEV1, but not significant per ATS criteria,improvement in FVC, but not significant per ATS criteria,improvement in FEV1 and FVC, but not significant per ATS criteria,no improvement}.  Allergy Studies: {Blank  single:19197::none,deferred due to recent antihistamine use,deferred due to insurance stipulations that require a separate visit for testing,labs sent instead, }    {Blank single:19197::Allergy testing results were read and interpreted by myself, documented by clinical staff., }         Marty Shaggy, MD [facility] 

## 2024-01-24 ENCOUNTER — Other Ambulatory Visit (HOSPITAL_BASED_OUTPATIENT_CLINIC_OR_DEPARTMENT_OTHER): Payer: Self-pay

## 2024-01-24 DIAGNOSIS — C44612 Basal cell carcinoma of skin of right upper limb, including shoulder: Secondary | ICD-10-CM | POA: Diagnosis not present

## 2024-01-24 MED ORDER — CELECOXIB 200 MG PO CAPS
200.0000 mg | ORAL_CAPSULE | Freq: Two times a day (BID) | ORAL | 0 refills | Status: AC | PRN
  Filled 2024-01-24: qty 14, 7d supply, fill #0

## 2024-01-31 LAB — KIT (D816V) DIGITAL PCR

## 2024-02-01 ENCOUNTER — Encounter: Payer: Self-pay | Admitting: Allergy & Immunology

## 2024-02-01 ENCOUNTER — Other Ambulatory Visit (HOSPITAL_BASED_OUTPATIENT_CLINIC_OR_DEPARTMENT_OTHER): Payer: Self-pay

## 2024-02-01 MED ORDER — LEVOCETIRIZINE DIHYDROCHLORIDE 5 MG PO TABS
5.0000 mg | ORAL_TABLET | Freq: Every day | ORAL | 5 refills | Status: AC
  Filled 2024-02-01: qty 30, 30d supply, fill #0
  Filled 2024-02-25: qty 30, 30d supply, fill #1
  Filled 2024-03-19: qty 30, 30d supply, fill #2
  Filled 2024-04-18: qty 30, 30d supply, fill #3

## 2024-02-03 ENCOUNTER — Ambulatory Visit: Payer: Self-pay | Admitting: Allergy & Immunology

## 2024-02-03 DIAGNOSIS — T782XXA Anaphylactic shock, unspecified, initial encounter: Secondary | ICD-10-CM

## 2024-02-03 DIAGNOSIS — R232 Flushing: Secondary | ICD-10-CM

## 2024-02-03 DIAGNOSIS — L539 Erythematous condition, unspecified: Secondary | ICD-10-CM

## 2024-02-04 DIAGNOSIS — R29810 Facial weakness: Secondary | ICD-10-CM | POA: Diagnosis not present

## 2024-02-04 DIAGNOSIS — R232 Flushing: Secondary | ICD-10-CM | POA: Diagnosis not present

## 2024-02-04 DIAGNOSIS — R599 Enlarged lymph nodes, unspecified: Secondary | ICD-10-CM | POA: Diagnosis not present

## 2024-02-04 DIAGNOSIS — L539 Erythematous condition, unspecified: Secondary | ICD-10-CM | POA: Diagnosis not present

## 2024-02-04 LAB — CBC WITH DIFF/PLATELET
Basophils Absolute: 0 x10E3/uL (ref 0.0–0.2)
Basos: 1 %
EOS (ABSOLUTE): 0.1 x10E3/uL (ref 0.0–0.4)
Eos: 1 %
Hematocrit: 42.5 % (ref 34.0–46.6)
Hemoglobin: 14.2 g/dL (ref 11.1–15.9)
Immature Grans (Abs): 0 x10E3/uL (ref 0.0–0.1)
Immature Granulocytes: 0 %
Lymphocytes Absolute: 2.5 x10E3/uL (ref 0.7–3.1)
Lymphs: 49 %
MCH: 32 pg (ref 26.6–33.0)
MCHC: 33.4 g/dL (ref 31.5–35.7)
MCV: 96 fL (ref 79–97)
Monocytes Absolute: 0.5 x10E3/uL (ref 0.1–0.9)
Monocytes: 9 %
Neutrophils Absolute: 2 x10E3/uL (ref 1.4–7.0)
Neutrophils: 40 %
Platelets: 357 x10E3/uL (ref 150–450)
RBC: 4.44 x10E6/uL (ref 3.77–5.28)
RDW: 11.7 % (ref 11.7–15.4)
WBC: 5.1 x10E3/uL (ref 3.4–10.8)

## 2024-02-04 LAB — KIT (D816V) DIGITAL PCR: CKIT Result: NEGATIVE

## 2024-02-04 LAB — ALLERGEN, COFFEE, RF221: Coffee: <0.1 kU/L

## 2024-02-04 LAB — CHRONIC URTICARIA PD-BAT: Pooled Donor- BAT CU: 11.9 % — ABNORMAL HIGH (ref 0.00–10.60)

## 2024-02-04 LAB — CMP14+EGFR
ALT: 28 IU/L (ref 0–32)
AST: 21 IU/L (ref 0–40)
Albumin: 4.7 g/dL (ref 3.9–4.9)
Alkaline Phosphatase: 63 IU/L (ref 41–116)
BUN/Creatinine Ratio: 10 (ref 9–23)
BUN: 8 mg/dL (ref 6–24)
Bilirubin Total: 0.4 mg/dL (ref 0.0–1.2)
CO2: 23 mmol/L (ref 20–29)
Calcium: 10 mg/dL (ref 8.7–10.2)
Chloride: 101 mmol/L (ref 96–106)
Creatinine, Ser: 0.79 mg/dL (ref 0.57–1.00)
Globulin, Total: 2.3 g/dL (ref 1.5–4.5)
Glucose: 80 mg/dL (ref 70–99)
Potassium: 4.3 mmol/L (ref 3.5–5.2)
Sodium: 144 mmol/L (ref 134–144)
Total Protein: 7 g/dL (ref 6.0–8.5)
eGFR: 93 mL/min/1.73 (ref >59)

## 2024-02-04 LAB — TRYPTASE: Tryptase: 8.3 ug/L (ref 2.2–13.2)

## 2024-02-04 LAB — HISTAMINE DETERMINATION, BLOOD: Histamine Determination, Blood: 54 ng/mL (ref 12–127)

## 2024-02-04 LAB — PROSTAGLANDIN D2, SERUM: Prostaglandin D2, serum: 169 pg/mL

## 2024-02-04 LAB — IGE: IgE (Immunoglobulin E), Serum: 7 [IU]/mL (ref 6–495)

## 2024-02-04 LAB — THYROID ANTIBODIES (THYROPEROXIDASE & THYROGLOBULIN)
Thyroglobulin Antibody: <1 [IU]/mL (ref 0.0–0.9)
Thyroperoxidase Ab SerPl-aCnc: 18 [IU]/mL (ref 0–34)

## 2024-02-06 ENCOUNTER — Other Ambulatory Visit (HOSPITAL_BASED_OUTPATIENT_CLINIC_OR_DEPARTMENT_OTHER): Payer: Self-pay

## 2024-02-06 DIAGNOSIS — Z0000 Encounter for general adult medical examination without abnormal findings: Secondary | ICD-10-CM | POA: Diagnosis not present

## 2024-02-06 DIAGNOSIS — D8940 Mast cell activation, unspecified: Secondary | ICD-10-CM | POA: Diagnosis not present

## 2024-02-06 DIAGNOSIS — B001 Herpesviral vesicular dermatitis: Secondary | ICD-10-CM | POA: Diagnosis not present

## 2024-02-06 DIAGNOSIS — R300 Dysuria: Secondary | ICD-10-CM | POA: Diagnosis not present

## 2024-02-06 LAB — TRYPTASE: Tryptase: 9.5 ug/L (ref 2.2–13.2)

## 2024-02-06 MED ORDER — VALACYCLOVIR HCL 500 MG PO TABS
500.0000 mg | ORAL_TABLET | Freq: Every day | ORAL | 4 refills | Status: AC
  Filled 2024-02-06 – 2024-02-20 (×2): qty 30, 30d supply, fill #0
  Filled 2024-03-19: qty 30, 30d supply, fill #1
  Filled 2024-04-18: qty 30, 30d supply, fill #2
  Filled 2024-05-21: qty 30, 30d supply, fill #3

## 2024-02-15 LAB — N-METHYLHISTAMINE, 24 HR, U
Collection Duration (h): 24 h
Creatinine Concent. 24 Hr, U: 46 mg/dL
Creatinine, 24 Hour, U: 1380 mg/(24.h) (ref 603–1783)
N-Methylhistamine, 24 Hr, U: 125 ug/g{creat} (ref 30–200)
Urine Volume (mL): 3000 mL

## 2024-02-15 LAB — LEUKOTRIENE E4, 24 HR, U
Collection Duration: 24 h
Creatinine Concentration,24 HR: 46 mg/dL
Creatinine, 24 HR, U: 1380 mg/(24.h) (ref 603–1783)
Leukotriene E4, U: 76 pg/mg{creat} (ref <=104)
Urine Volume: 3000 mL

## 2024-02-15 LAB — 2,3-DINOR 11BPG F2, 24HR URINE
2,3-dinor 11B-Prostaglandin: <680 pg/mg{creat} (ref <1802)
Creatinine Concentration,24 HR: 46 mg/dL
Creatinine, 24 HR, U: 1380 mg/(24.h) (ref 603–1783)

## 2024-02-15 LAB — PROSTAGLANDIN D2/CREATININE, U
Creatinine, Urine: 35 mg/dL
Prostaglandin D2, urine: 1.2 pg/mL
Prostaglandin D2/Cr Ratio: 3.4 ng/g

## 2024-02-16 ENCOUNTER — Other Ambulatory Visit (HOSPITAL_BASED_OUTPATIENT_CLINIC_OR_DEPARTMENT_OTHER): Payer: Self-pay

## 2024-02-20 ENCOUNTER — Other Ambulatory Visit (HOSPITAL_BASED_OUTPATIENT_CLINIC_OR_DEPARTMENT_OTHER): Payer: Self-pay

## 2024-02-22 ENCOUNTER — Encounter: Payer: Self-pay | Admitting: Family Medicine

## 2024-02-22 ENCOUNTER — Ambulatory Visit (INDEPENDENT_AMBULATORY_CARE_PROVIDER_SITE_OTHER): Admitting: Family Medicine

## 2024-02-22 ENCOUNTER — Other Ambulatory Visit: Payer: Self-pay

## 2024-02-22 VITALS — BP 90/70 | HR 65 | Temp 98.5°F

## 2024-02-22 DIAGNOSIS — T782XXA Anaphylactic shock, unspecified, initial encounter: Secondary | ICD-10-CM | POA: Insufficient documentation

## 2024-02-22 DIAGNOSIS — T782XXD Anaphylactic shock, unspecified, subsequent encounter: Secondary | ICD-10-CM

## 2024-02-22 DIAGNOSIS — R232 Flushing: Secondary | ICD-10-CM | POA: Diagnosis not present

## 2024-02-22 DIAGNOSIS — L501 Idiopathic urticaria: Secondary | ICD-10-CM

## 2024-02-22 NOTE — Patient Instructions (Addendum)
 Concern for mast cell activation syndrome/urticaria/facial flushing - Mast cell activation syndrome (MCAS) is a condition that doctors may consider when patients have many different symptoms and certain blood test results. The diagnosis is still debated, and the rules for making it are still changing. Right now, doctors may think about MCAS if the blood test called serum tryptase goes up a lot during a flare of symptoms and then goes back down to normal once the symptoms calm down. Another clue is when symptoms get much better with medicines that block mast cells, like antihistamines (H1 or H2 blockers) or montelukast . Continue to treat symptoms with a mix of antihistamines and other medicines that help reduce mast cell activity. If symptoms are severe, we may think about adding Xolair (omalizumab), which has been shown to lower the chance of mast cell-related problems, including serious allergic reactions. Also, if you know certain things trigger your symptoms, it is best to avoid those triggers when possible. - Start suppressive dosing of antihistamines:   - Morning: Xyzal (levocetirizine) 1-2 tablets + Pepcid (famotidine) 20mg   - Evening: Xyzal (levocetirizine) 1-2 tablets + Pepcid (famotidine) 20mg  + Singulair  (montelukast ) 10mg   - You can change this dosing at home, decreasing the dose as needed or increasing the dosing as needed, just as you are doing.  - Will submit for Xolair today. You will next hear from our Xolair coordinator, Tammy, with next steps - HAT testing collected at today's visit. We will call you when the results become available  Idiopathic anaphylaxis Continue to avoid triggering foods. In case of an allergic reaction, take cetirizine 10 mg once every 12-24 hours, and if life-threatening symptoms occur, inject with EpiPen  0.3 mg.  Call the clinic if this treatment plan is not working well for you  Follow up in 2 months or sooner if needed.

## 2024-02-22 NOTE — Progress Notes (Signed)
 [address] [place] [address] Dept: [phone]  FOLLOW UP NOTE  Patient ID: Lydia Torres, female    DOB: 1975-08-16  Age: 48 y.o. MRN: [id number] Date of Office Visit: 02/22/2024  Assessment  Chief Complaint: Follow-up (No concerns) and Anxiety  HPI Lydia Torres is a 48 year old female who presents to the clinic for follow-up visit.  She was last seen in this clinic on 01/22/2024 by Dr. Iva for evaluation of swelling lymph nodes, erythema, facial flushing, hives and idiopathic anaphylaxis.  At that visit, extensive workup for mast cell activation syndrome including C-kit mutation analysis was negative.  Chronic urticaria panel was slightly positive.  At today's visit, she reports that she is interested in starting Xolair and moving forward with hereditary alpha tryptase testing.  Discussed the use of AI scribe software for clinical note transcription with the patient, who gave verbal consent to proceed.  History of Present Illness Lydia Torres is a 48 year old female who presents for follow-up and management of suspected mast cell activation syndrome.  She has been experiencing symptoms that her primary care doctor suspects may be due to mast cell activation syndrome. Antihistamines were initiated, leading to significant improvement, with her describing herself as 'a totally different person.' All tests conducted, except for chronic urticaria panel, returned normal results.  Her last tryptase was 9.5 and chronic urticaria panel was slightly elevated at 11.90.  She reports that she had to stop antihistamines for three days for a urine test, during which she developed cystitis-like symptoms, including pain, pressure, and difficulty urinating. A UTI screen was normal. She resumed antihistamines, including Xyzal twice daily, Pepcid 80 mg, and hydroxyzine , and reported being 'ninety-nine percent back' to normal after two weeks.  She experiences flares primarily  triggered by food, starting with burning in her right ear, followed by skin symptoms such as itching, hives, and redness, particularly on her neck and chest. These symptoms have improved since changing her IUD. Cold or hot weather can also trigger symptoms, beginning with burning ears and progressing to her neck and chest. She has experienced hives, particularly after dry needling and acupuncture, and has had near anaphylactic reactions, which were managed with Benadryl . She carries an EpiPen  but has not needed to use it.  Her current medications are listed in the chart.   Drug Allergies:  Allergies  Allergen Reactions   Oxycodone     Codeine Rash   Phenergan [Promethazine Hcl] Rash   Sulfa Antibiotics Rash    Physical Exam: BP 90/70   Pulse 65   Temp 98.5 F (36.9 C)   SpO2 99%    Physical Exam Vitals reviewed.  Constitutional:      Appearance: Normal appearance.  HENT:     Head: Normocephalic and atraumatic.     Right Ear: Tympanic membrane normal.     Left Ear: Tympanic membrane normal.     Nose:     Comments: Bilateral nares slightly erythematous with thin clear nasal drainage noted.  Pharynx normal.  Ears normal.  Eyes normal.    Mouth/Throat:     Pharynx: Oropharynx is clear.  Eyes:     Conjunctiva/sclera: Conjunctivae normal.  Cardiovascular:     Rate and Rhythm: Normal rate and regular rhythm.     Heart sounds: Normal heart sounds. No murmur heard. Pulmonary:     Effort: Pulmonary effort is normal.     Breath sounds: Normal breath sounds.     Comments: Lungs clear to auscultation Musculoskeletal:  General: Normal range of motion.     Cervical back: Normal range of motion and neck supple.  Skin:    General: Skin is warm and dry.  Neurological:     Mental Status: She is alert and oriented to person, place, and time.  Psychiatric:        Mood and Affect: Mood normal.        Behavior: Behavior normal.        Thought Content: Thought content normal.         Judgment: Judgment normal.     Assessment and Plan: 1. Idiopathic urticaria   2. Idiopathic anaphylaxis, subsequent encounter   3. Flushing      Patient Instructions  Concern for mast cell activation syndrome/urticaria/facial flushing - Mast cell activation syndrome (MCAS) is a condition that doctors may consider when patients have many different symptoms and certain blood test results. The diagnosis is still debated, and the rules for making it are still changing. Right now, doctors may think about MCAS if the blood test called serum tryptase goes up a lot during a flare of symptoms and then goes back down to normal once the symptoms calm down. Another clue is when symptoms get much better with medicines that block mast cells, like antihistamines (H1 or H2 blockers) or montelukast . Continue to treat symptoms with a mix of antihistamines and other medicines that help reduce mast cell activity. If symptoms are severe, we may think about adding Xolair (omalizumab), which has been shown to lower the chance of mast cell-related problems, including serious allergic reactions. Also, if you know certain things trigger your symptoms, it is best to avoid those triggers when possible. - Start suppressive dosing of antihistamines:   - Morning: Xyzal (levocetirizine) 1-2 tablets + Pepcid (famotidine) 20mg   - Evening: Xyzal (levocetirizine) 1-2 tablets + Pepcid (famotidine) 20mg  + Singulair  (montelukast ) 10mg   - You can change this dosing at home, decreasing the dose as needed or increasing the dosing as needed, just as you are doing.  - Will submit for Xolair today. You will next hear from our Xolair coordinator, Tammy, with next steps - HAT testing collected at today's visit. We will call you when the results become available  Idiopathic anaphylaxis Continue to avoid triggering foods. In case of an allergic reaction, take cetirizine 10 mg once every 12-24 hours, and if life-threatening symptoms occur,  inject with EpiPen  0.3 mg.  Call the clinic if this treatment plan is not working well for you  Follow up in 2 months or sooner if needed.   Return in about 2 months (around 04/23/2024), or if symptoms worsen or fail to improve.    Thank you for the opportunity to care for this patient.  Please do not hesitate to contact me with questions.  Arlean Mutter, FNP [facility] 

## 2024-02-26 ENCOUNTER — Telehealth: Payer: Self-pay | Admitting: *Deleted

## 2024-02-26 NOTE — Telephone Encounter (Signed)
 Excellent.  Thank you

## 2024-02-26 NOTE — Telephone Encounter (Signed)
 Called patient and advised Xolair approval, copay card and submit to [employer]. Will reach out once delivery set to make appt for patient to start therapy in clinic with one hour wait

## 2024-03-06 ENCOUNTER — Ambulatory Visit (INDEPENDENT_AMBULATORY_CARE_PROVIDER_SITE_OTHER)

## 2024-03-06 DIAGNOSIS — L501 Idiopathic urticaria: Secondary | ICD-10-CM

## 2024-03-06 MED ORDER — OMALIZUMAB 300 MG/2  ML ~~LOC~~ SOSY
300.0000 mg | PREFILLED_SYRINGE | Freq: Once | SUBCUTANEOUS | Status: AC
  Administered 2024-03-06: 300 mg via SUBCUTANEOUS

## 2024-03-13 ENCOUNTER — Ambulatory Visit: Admitting: Allergy & Immunology

## 2024-03-19 ENCOUNTER — Other Ambulatory Visit (HOSPITAL_BASED_OUTPATIENT_CLINIC_OR_DEPARTMENT_OTHER): Payer: Self-pay

## 2024-03-19 ENCOUNTER — Other Ambulatory Visit: Payer: Self-pay

## 2024-03-19 MED ORDER — MONTELUKAST SODIUM 10 MG PO TABS
10.0000 mg | ORAL_TABLET | Freq: Every day | ORAL | 2 refills | Status: DC
  Filled 2024-03-19: qty 30, 30d supply, fill #0
  Filled 2024-04-18: qty 30, 30d supply, fill #1

## 2024-03-20 ENCOUNTER — Encounter: Payer: Self-pay | Admitting: Family Medicine

## 2024-03-21 NOTE — Telephone Encounter (Signed)
 I havent seen any results yet. I have been looking for them though. Can you please ask if they charged her for the testing yet? Thank you

## 2024-03-24 ENCOUNTER — Other Ambulatory Visit (HOSPITAL_BASED_OUTPATIENT_CLINIC_OR_DEPARTMENT_OTHER): Payer: Self-pay

## 2024-03-25 ENCOUNTER — Other Ambulatory Visit (HOSPITAL_BASED_OUTPATIENT_CLINIC_OR_DEPARTMENT_OTHER): Payer: Self-pay

## 2024-03-26 ENCOUNTER — Other Ambulatory Visit (HOSPITAL_BASED_OUTPATIENT_CLINIC_OR_DEPARTMENT_OTHER): Payer: Self-pay

## 2024-03-26 MED ORDER — ESCITALOPRAM OXALATE 5 MG PO TABS
5.0000 mg | ORAL_TABLET | Freq: Every day | ORAL | 2 refills | Status: AC
  Filled 2024-03-26: qty 30, 30d supply, fill #0
  Filled 2024-04-18: qty 30, 30d supply, fill #1
  Filled 2024-05-21: qty 30, 30d supply, fill #2

## 2024-04-01 NOTE — Telephone Encounter (Signed)
 I will contact the company today. Thank you

## 2024-04-03 ENCOUNTER — Ambulatory Visit

## 2024-04-03 DIAGNOSIS — L501 Idiopathic urticaria: Secondary | ICD-10-CM | POA: Diagnosis not present

## 2024-04-03 MED ORDER — OMALIZUMAB 300 MG/2  ML ~~LOC~~ SOSY
300.0000 mg | PREFILLED_SYRINGE | Freq: Once | SUBCUTANEOUS | Status: AC
  Administered 2024-04-03: 300 mg via SUBCUTANEOUS

## 2024-04-08 ENCOUNTER — Other Ambulatory Visit (HOSPITAL_BASED_OUTPATIENT_CLINIC_OR_DEPARTMENT_OTHER): Payer: Self-pay

## 2024-04-11 ENCOUNTER — Telehealth: Payer: Self-pay | Admitting: Family Medicine

## 2024-04-11 NOTE — Telephone Encounter (Signed)
 error

## 2024-04-15 ENCOUNTER — Encounter: Payer: Self-pay | Admitting: Family Medicine

## 2024-04-16 NOTE — Telephone Encounter (Signed)
 Can you please let her know the paperwork is filled out and ready to go in the [place] office. I just checked woth Dr. KANDICE and we do not fill out the billing part. I started to fill it out but crossed through it. Thank you

## 2024-04-17 ENCOUNTER — Ambulatory Visit: Admitting: Allergy & Immunology

## 2024-04-18 ENCOUNTER — Other Ambulatory Visit (HOSPITAL_BASED_OUTPATIENT_CLINIC_OR_DEPARTMENT_OTHER): Payer: Self-pay

## 2024-04-18 ENCOUNTER — Other Ambulatory Visit: Payer: Self-pay

## 2024-04-28 ENCOUNTER — Encounter: Payer: Self-pay | Admitting: Podiatry

## 2024-04-28 ENCOUNTER — Ambulatory Visit (INDEPENDENT_AMBULATORY_CARE_PROVIDER_SITE_OTHER)

## 2024-04-28 ENCOUNTER — Ambulatory Visit: Admitting: Podiatry

## 2024-04-28 DIAGNOSIS — M7751 Other enthesopathy of right foot: Secondary | ICD-10-CM

## 2024-04-28 DIAGNOSIS — M6740 Ganglion, unspecified site: Secondary | ICD-10-CM

## 2024-04-28 NOTE — Progress Notes (Signed)
 Subjective:   Patient ID: Lydia Torres, female   DOB: 48 y.o.   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  Reappoint as symptoms indicate may require surgical excision in future  X-rays indicate no signs of bone spur history of bunion correction which has remained stable

## 2024-04-30 ENCOUNTER — Ambulatory Visit

## 2024-04-30 DIAGNOSIS — L501 Idiopathic urticaria: Secondary | ICD-10-CM | POA: Diagnosis not present

## 2024-04-30 MED ORDER — OMALIZUMAB 300 MG/2  ML ~~LOC~~ SOSY
300.0000 mg | PREFILLED_SYRINGE | Freq: Once | SUBCUTANEOUS | Status: AC
  Administered 2024-04-30: 300 mg via SUBCUTANEOUS

## 2024-05-05 ENCOUNTER — Other Ambulatory Visit (HOSPITAL_BASED_OUTPATIENT_CLINIC_OR_DEPARTMENT_OTHER): Payer: Self-pay

## 2024-05-05 MED ORDER — CELECOXIB 200 MG PO CAPS
200.0000 mg | ORAL_CAPSULE | Freq: Every day | ORAL | 2 refills | Status: AC
  Filled 2024-05-05: qty 30, 30d supply, fill #0

## 2024-05-07 ENCOUNTER — Other Ambulatory Visit (HOSPITAL_BASED_OUTPATIENT_CLINIC_OR_DEPARTMENT_OTHER): Payer: Self-pay

## 2024-05-13 ENCOUNTER — Other Ambulatory Visit: Payer: Self-pay

## 2024-05-13 ENCOUNTER — Encounter: Payer: Self-pay | Admitting: Allergy & Immunology

## 2024-05-13 ENCOUNTER — Other Ambulatory Visit (HOSPITAL_BASED_OUTPATIENT_CLINIC_OR_DEPARTMENT_OTHER): Payer: Self-pay

## 2024-05-13 ENCOUNTER — Ambulatory Visit: Admitting: Allergy & Immunology

## 2024-05-13 VITALS — BP 100/74 | HR 66 | Temp 98.1°F | Resp 18 | Ht 65.0 in | Wt 189.7 lb

## 2024-05-13 DIAGNOSIS — L501 Idiopathic urticaria: Secondary | ICD-10-CM | POA: Diagnosis not present

## 2024-05-13 DIAGNOSIS — L539 Erythematous condition, unspecified: Secondary | ICD-10-CM | POA: Diagnosis not present

## 2024-05-13 DIAGNOSIS — R599 Enlarged lymph nodes, unspecified: Secondary | ICD-10-CM | POA: Diagnosis not present

## 2024-05-13 DIAGNOSIS — R232 Flushing: Secondary | ICD-10-CM | POA: Diagnosis not present

## 2024-05-13 DIAGNOSIS — R29810 Facial weakness: Secondary | ICD-10-CM

## 2024-05-13 MED ORDER — CROMOLYN SODIUM 100 MG/5ML PO CONC
200.0000 mg | Freq: Three times a day (TID) | ORAL | 3 refills | Status: DC
  Filled 2024-05-13: qty 480, 12d supply, fill #0

## 2024-05-13 MED ORDER — MONTELUKAST SODIUM 10 MG PO TABS
10.0000 mg | ORAL_TABLET | Freq: Every day | ORAL | 3 refills | Status: AC
  Filled 2024-05-13: qty 90, 90d supply, fill #0

## 2024-05-13 NOTE — Progress Notes (Signed)
 "  FOLLOW UP  Date of Service/Encounter:  05/13/2024   Assessment:   Swollen lymph nodes - resolved  Constellation of symptoms concerning for MCAS (erythema, flushing, facial weakness, anaphylaxis) - with negative MCAS labs and improvement with Xolair   Recent right knee injury - MRI pending to determine the next steps  Reactive airway disease - has QVAR  to use as needed  Plan/Recommendations:   Concern for mast cell activation syndrome/urticaria/facial flushing - Xolair  has clearly helped, but we can increase to an injection every two weeks instead.  - We will have you give your own injection and then you can do it at home to make things easier.  - Continue to treat symptoms with a mix of antihistamines and other medicines that help reduce mast cell activity. If symptoms are severe, we may think about adding Xolair  (omalizumab ), which has been shown to lower the chance of mast cell-related problems, including serious allergic reactions. Also, if you know certain things trigger your symptoms, it is best to avoid those triggers when possible. - Continue with suppressive dosing of antihistamines:   - Morning: Xyzal  (levocetirizine) 1-2 tablets + Pepcid (famotidine) 20mg   - Evening: Xyzal  (levocetirizine) 1-2 tablets + Pepcid (famotidine) 20mg  + Singulair  (montelukast ) 10mg   - HAT testing is still pending (I just asked Arlean).  - We can also add on cromolyn  taken 30 minutes before eating to see if that can help your GI symptoms.   Idiopathic anaphylaxis - Continue to avoid triggering foods.  - In case of an allergic reaction, take cetirizine 10 mg once every 12-24 hours. - If life-threatening symptoms occur, inject with EpiPen  0.3 mg.  3. Return in about 3 months (around 08/11/2024). You can have the follow up appointment with Dr. Iva or a Nurse Practicioner (our Nurse Practitioners are excellent and always have Physician oversight!).   Subjective:   Lydia Torres is a 49 y.o.  female presenting today for follow up of  Chief Complaint  Patient presents with   Follow-up    Patient feels like Xoliar shot is not lasting until next shot. Its possiblethe patient may be having surgery and wanting to know what she needs to prior to her surgery.     Lydia Torres has a history of the following: Patient Active Problem List   Diagnosis Date Noted   Flushing 02/22/2024   Idiopathic anaphylaxis 02/22/2024   Idiopathic urticaria 02/22/2024   Peripheral neuropathy 04/18/2023   Post herpetic neuralgia 04/18/2023   Sciatica of right side 03/12/2023   Cervical radiculopathy 03/12/2023   Shingles 02/02/2023   Polyarthralgia 01/29/2023   Chronic bilateral low back pain without sciatica 01/29/2023   Neck pain 01/29/2023   Lymphadenopathy 01/29/2023   Basal cell carcinoma (BCC) of skin of right upper extremity including shoulder 12/06/2022   Class 1 obesity due to excess calories with serious comorbidity and body mass index (BMI) of 30.0 to 30.9 in adult 12/06/2022   GAD (generalized anxiety disorder) 12/06/2022   Primary osteoarthritis of left knee 12/06/2022   IUD (intrauterine device) in place, Mirena  inserted 06/12/16 07/03/2017   History of cold sores, HSV-1 07/03/2017    History obtained from: chart review and patient.  Discussed the use of AI scribe software for clinical note transcription with the patient and/or guardian, who gave verbal consent to proceed.  Lydia Torres is a 49 y.o. female presenting for a follow up visit.  She was last seen in October 2025.  At that time, she was continuing to have  allergic reaction type symptoms.  We continued with suppressive doses of antihistamine including Xyzal  and Pepcid in the morning and Xyzal  and Pepcid and Singulair  at night.  We also submitted for initiation of Xolair .  We collected HAT testing.  She also continue to avoid her triggering foods.  EpiPen  is up-to-date.  Since the last visit, she has done relatively well.    Xolair  is a helping a LOT. She has noticed some small improvements within one month and improving more each time. Lymph nodes are improved and resolved. She is not having skin flares at all any longer. She is having some reactions occasionally, but it is now GI related. She thinks that she still has cystitis that flares up occasionally.   She recently sustained a knee injury while skiing in [place], where she slipped on ice and did a split, leading to a suspected MCL tear. She is awaiting MRI results to determine if surgery is necessary. The injury has impacted her ability to drive and work, as it involves her right foot, and she is preparing to go on leave for a few weeks.  She is currently on Xolair , which has significantly improved her symptoms, including chronic lymph node swelling and skin flares. She experiences occasional gastrointestinal reactions and cystitis flares, which she manages with extra antihistamines like hydroxyzine , Xyzal , and Pepcid. Her regular medication regimen includes Xyzal  and Pepcid twice daily, and Singulair  at night.  She has been experiencing gastrointestinal symptoms, particularly after taking Celebrex  for her knee pain. She reports diarrhea with bright yellow stools occurring about an hour after taking the medication, which has been improving over the past week. She associates similar symptoms with certain foods that cause stomach cramping and an urgent need to use the bathroom.  She has a high pain tolerance and prefers to avoid opioids due to past negative experiences. She manages her pain primarily with Celebrex  and Tylenol , avoiding ibuprofen  due to a previous breathing issue after taking it.  Her current medication regimen for mast cell activation syndrome includes Xolair  injections every four weeks, which she feels wears off by day 20, causing swelling. She also uses Singulair , Xyzal , and Pepcid regularly, with adjustments during flares.     Otherwise, there  have been no changes to her past medical history, surgical history, family history, or social history.    Review of systems otherwise negative other than that mentioned in the HPI.    Objective:   Blood pressure 100/74, pulse 66, temperature 98.1 F (36.7 C), temperature source Temporal, resp. rate 18, height 5' 5 (1.651 m), weight 189 lb 11.2 oz (86 kg), SpO2 100%. Body mass index is 31.57 kg/m.    Physical Exam Vitals reviewed.  Constitutional:      Appearance: She is well-developed.     Comments: Very talkative.   HENT:     Head: Normocephalic and atraumatic.     Right Ear: Tympanic membrane, ear canal and external ear normal. No drainage, swelling or tenderness. Tympanic membrane is not injected, scarred, erythematous, retracted or bulging.     Left Ear: Tympanic membrane, ear canal and external ear normal. No drainage, swelling or tenderness. Tympanic membrane is not injected, scarred, erythematous, retracted or bulging.     Nose: Mucosal edema and rhinorrhea present. No nasal deformity or septal deviation.     Right Turbinates: Enlarged and swollen.     Left Turbinates: Enlarged and swollen.     Right Sinus: No maxillary sinus tenderness or frontal sinus tenderness.  Left Sinus: No maxillary sinus tenderness or frontal sinus tenderness.     Mouth/Throat:     Mouth: Mucous membranes are not pale and not dry.     Pharynx: Uvula midline.  Eyes:     General: Allergic shiner present.        Right eye: No discharge.        Left eye: No discharge.     Conjunctiva/sclera: Conjunctivae normal.     Right eye: Right conjunctiva is not injected. No chemosis.    Left eye: Left conjunctiva is not injected. No chemosis.    Pupils: Pupils are equal, round, and reactive to light.  Cardiovascular:     Rate and Rhythm: Normal rate and regular rhythm.     Heart sounds: Normal heart sounds.  Pulmonary:     Effort: Pulmonary effort is normal. No tachypnea, accessory muscle usage or  respiratory distress.     Breath sounds: Normal breath sounds. No wheezing, rhonchi or rales.  Chest:     Chest wall: No tenderness.  Abdominal:     Tenderness: There is no abdominal tenderness. There is no guarding or rebound.  Lymphadenopathy:     Head:     Right side of head: No submandibular, tonsillar or occipital adenopathy.     Left side of head: No submandibular, tonsillar or occipital adenopathy.     Cervical: No cervical adenopathy.  Skin:    General: Skin is warm.     Capillary Refill: Capillary refill takes less than 2 seconds.     Coloration: Skin is not pale.     Findings: No abrasion, erythema, petechiae or rash. Rash is not papular, urticarial or vesicular.     Comments: She does have fair skin. No urticaria today. 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 There are some excoriations noted.   Neurological:     Mental Status: She is alert.  Psychiatric:        Behavior: Behavior is cooperative.      Diagnostic studies: none       Marty Shaggy, MD  [facility]        "

## 2024-05-13 NOTE — Patient Instructions (Addendum)
 Concern for mast cell activation syndrome/urticaria/facial flushing - Xolair  has clearly helped, but we can increase to an injection every two weeks instead.  - We will have you give your own injection and then you can do it at home to make things easier.  - Continue to treat symptoms with a mix of antihistamines and other medicines that help reduce mast cell activity. If symptoms are severe, we may think about adding Xolair  (omalizumab ), which has been shown to lower the chance of mast cell-related problems, including serious allergic reactions. Also, if you know certain things trigger your symptoms, it is best to avoid those triggers when possible. - Continue with suppressive dosing of antihistamines:   - Morning: Xyzal  (levocetirizine) 1-2 tablets + Pepcid (famotidine) 20mg   - Evening: Xyzal  (levocetirizine) 1-2 tablets + Pepcid (famotidine) 20mg  + Singulair  (montelukast ) 10mg   - HAT testing is still pending (I just asked Arlean).  - We can also add on cromolyn  taken 30 minutes before eating to see if that can help your GI symptoms.   Idiopathic anaphylaxis - Continue to avoid triggering foods.  - In case of an allergic reaction, take cetirizine 10 mg once every 12-24 hours. - If life-threatening symptoms occur, inject with EpiPen  0.3 mg.  3. Return in about 3 months (around 08/11/2024). You can have the follow up appointment with Dr. Iva or a Nurse Practicioner (our Nurse Practitioners are excellent and always have Physician oversight!).    Please inform us  of any Emergency Department visits, hospitalizations, or changes in symptoms. Call us  before going to the ED for breathing or allergy symptoms since we might be able to fit you in for a sick visit. Feel free to contact us  anytime with any questions, problems, or concerns.  It was a pleasure to see you again today!  Websites that have reliable patient information: 1. American Academy of Asthma, Allergy, and Immunology:  www.aaaai.org 2. Food Allergy Research and Education (FARE): foodallergy.org 3. Mothers of Asthmatics: http://www.asthmacommunitynetwork.org 4. American College of Allergy, Asthma, and Immunology: www.acaai.org      Like us  on [employer] and Instagram for our latest updates!      A healthy democracy works best when [employer] participate! Make sure you are registered to vote! If you have moved or changed any of your contact information, you will need to get this updated before voting! Scan the QR codes below to learn more!

## 2024-05-14 ENCOUNTER — Telehealth: Payer: Self-pay | Admitting: *Deleted

## 2024-05-14 ENCOUNTER — Encounter: Payer: Self-pay | Admitting: Allergy & Immunology

## 2024-05-14 ENCOUNTER — Other Ambulatory Visit (HOSPITAL_BASED_OUTPATIENT_CLINIC_OR_DEPARTMENT_OTHER): Payer: Self-pay

## 2024-05-14 NOTE — Telephone Encounter (Signed)
 Called patient to advise will send rx with increase dose to [facility] since she has active approval they dont ususally need another PA for dose increase. Patient advised new prescription plan so she will upload to my chart and I will work on the new auth

## 2024-05-14 NOTE — Telephone Encounter (Signed)
 Working on approval and spoke to patient

## 2024-05-14 NOTE — Telephone Encounter (Signed)
-----   Message from Marty Shaggy, MD sent at 05/13/2024  9:32 AM EST ----- CAN we increase to every 2 weeks for her Xolair .

## 2024-05-16 ENCOUNTER — Telehealth: Payer: Self-pay | Admitting: Family Medicine

## 2024-05-16 NOTE — Telephone Encounter (Signed)
 Patient notified of HAT testing within normal limits.

## 2024-05-19 ENCOUNTER — Other Ambulatory Visit: Payer: Self-pay

## 2024-05-19 ENCOUNTER — Other Ambulatory Visit (HOSPITAL_BASED_OUTPATIENT_CLINIC_OR_DEPARTMENT_OTHER): Payer: Self-pay

## 2024-05-19 MED ORDER — CROMOLYN SODIUM 100 MG/5ML PO CONC
200.0000 mg | Freq: Three times a day (TID) | ORAL | 3 refills | Status: AC
  Filled 2024-05-19 – 2024-05-22 (×3): qty 1440, 36d supply, fill #0
  Filled ????-??-??: fill #0

## 2024-05-20 ENCOUNTER — Other Ambulatory Visit (HOSPITAL_BASED_OUTPATIENT_CLINIC_OR_DEPARTMENT_OTHER): Payer: Self-pay

## 2024-05-20 MED ORDER — CELECOXIB 200 MG PO CAPS
200.0000 mg | ORAL_CAPSULE | Freq: Two times a day (BID) | ORAL | 2 refills | Status: AC
  Filled 2024-05-20 – 2024-05-21 (×2): qty 60, 30d supply, fill #0

## 2024-05-21 ENCOUNTER — Other Ambulatory Visit (HOSPITAL_BASED_OUTPATIENT_CLINIC_OR_DEPARTMENT_OTHER): Payer: Self-pay

## 2024-05-21 NOTE — Telephone Encounter (Signed)
 Well, dang. Does she want to try Rhapsido instead?

## 2024-05-21 NOTE — Telephone Encounter (Signed)
 Patient Ins denied increase in Xolair  due to FDA indicated dosing. Pleas advise

## 2024-05-22 ENCOUNTER — Encounter: Payer: Self-pay | Admitting: Allergy & Immunology

## 2024-05-22 ENCOUNTER — Other Ambulatory Visit (HOSPITAL_BASED_OUTPATIENT_CLINIC_OR_DEPARTMENT_OTHER): Payer: Self-pay

## 2024-05-22 NOTE — Telephone Encounter (Signed)
 Spoke to patient and advised her increased dosing for Xolair  denied by [employer]. Discussed Rhapsido with patient and she is willing to give it a try. Will work on approval and reach back out to patient

## 2024-05-23 ENCOUNTER — Other Ambulatory Visit (HOSPITAL_BASED_OUTPATIENT_CLINIC_OR_DEPARTMENT_OTHER): Payer: Self-pay

## 2024-05-27 ENCOUNTER — Ambulatory Visit: Admitting: Neurology

## 2024-05-29 ENCOUNTER — Ambulatory Visit

## 2024-08-12 ENCOUNTER — Ambulatory Visit: Admitting: Allergy & Immunology
